# Patient Record
Sex: Male | Born: 1940 | Race: White | Hispanic: No | Marital: Married | Smoking: Former smoker
Health system: Southern US, Community
[De-identification: ages and names within clinical notes are randomized; demographics above are authoritative.]

## PROBLEM LIST (undated history)

## (undated) DIAGNOSIS — G4730 Sleep apnea, unspecified: Secondary | ICD-10-CM

## (undated) DIAGNOSIS — E871 Hypo-osmolality and hyponatremia: Secondary | ICD-10-CM

## (undated) DIAGNOSIS — E119 Type 2 diabetes mellitus without complications: Secondary | ICD-10-CM

## (undated) DIAGNOSIS — F319 Bipolar disorder, unspecified: Secondary | ICD-10-CM

## (undated) DIAGNOSIS — N401 Enlarged prostate with lower urinary tract symptoms: Secondary | ICD-10-CM

## (undated) DIAGNOSIS — E785 Hyperlipidemia, unspecified: Secondary | ICD-10-CM

## (undated) DIAGNOSIS — F32A Depression, unspecified: Secondary | ICD-10-CM

## (undated) DIAGNOSIS — F329 Major depressive disorder, single episode, unspecified: Secondary | ICD-10-CM

## (undated) DIAGNOSIS — N138 Other obstructive and reflux uropathy: Secondary | ICD-10-CM

## (undated) DIAGNOSIS — K5792 Diverticulitis of intestine, part unspecified, without perforation or abscess without bleeding: Secondary | ICD-10-CM

## (undated) HISTORY — PX: BACK SURGERY: SHX140

## (undated) HISTORY — DX: Benign prostatic hyperplasia with lower urinary tract symptoms: N40.1

## (undated) HISTORY — DX: Other obstructive and reflux uropathy: N13.8

## (undated) HISTORY — DX: Depression, unspecified: F32.A

## (undated) HISTORY — DX: Hyperlipidemia, unspecified: E78.5

## (undated) HISTORY — DX: Major depressive disorder, single episode, unspecified: F32.9

## (undated) HISTORY — DX: Type 2 diabetes mellitus without complications: E11.9

## (undated) HISTORY — PX: TONSILLECTOMY: SUR1361

## (undated) HISTORY — DX: Hypo-osmolality and hyponatremia: E87.1

## (undated) HISTORY — DX: Bipolar disorder, unspecified: F31.9

## (undated) HISTORY — DX: Diverticulitis of intestine, part unspecified, without perforation or abscess without bleeding: K57.92

## (undated) HISTORY — DX: Sleep apnea, unspecified: G47.30

---

## 2000-10-10 HISTORY — PX: CIRCUMCISION: SUR203

## 2009-08-17 ENCOUNTER — Ambulatory Visit: Payer: Self-pay | Admitting: Gastroenterology

## 2009-08-17 LAB — HM COLONOSCOPY

## 2009-08-27 LAB — HM COLONOSCOPY

## 2012-09-13 ENCOUNTER — Ambulatory Visit: Payer: Self-pay

## 2012-10-24 ENCOUNTER — Emergency Department: Payer: Self-pay | Admitting: Emergency Medicine

## 2012-10-24 LAB — COMPREHENSIVE METABOLIC PANEL
Alkaline Phosphatase: 91 U/L (ref 50–136)
Co2: 25 mmol/L (ref 21–32)
Creatinine: 0.91 mg/dL (ref 0.60–1.30)
EGFR (African American): >60
EGFR (Non-African Amer.): >60
Glucose: 215 mg/dL — ABNORMAL HIGH (ref 65–99)
Osmolality: 278 (ref 275–301)
SGOT(AST): 19 U/L (ref 15–37)
SGPT (ALT): 27 U/L (ref 12–78)
Sodium: 133 mmol/L — ABNORMAL LOW (ref 136–145)
Total Protein: 7.7 g/dL (ref 6.4–8.2)

## 2012-10-24 LAB — URINALYSIS, COMPLETE
Bacteria: NONE SEEN
Bilirubin,UR: NEGATIVE
Blood: NEGATIVE
Ketone: NEGATIVE
Leukocyte Esterase: NEGATIVE
Ph: 5 (ref 4.5–8.0)
RBC,UR: <1 /HPF (ref 0–5)
Specific Gravity: 1.026 (ref 1.003–1.030)
Squamous Epithelial: NONE SEEN
WBC UR: 1 /HPF (ref 0–5)

## 2012-10-24 LAB — CBC
HCT: 42.6 % (ref 40.0–52.0)
HGB: 14.6 g/dL (ref 13.0–18.0)
MCH: 28.5 pg (ref 26.0–34.0)
MCHC: 34.3 g/dL (ref 32.0–36.0)
Platelet: 300 10*3/uL (ref 150–440)

## 2012-10-24 LAB — CK TOTAL AND CKMB (NOT AT ARMC): CK, Total: 88 U/L (ref 35–232)

## 2012-10-24 LAB — TROPONIN I: Troponin-I: <0.02 ng/mL

## 2013-12-02 ENCOUNTER — Ambulatory Visit: Payer: Self-pay | Admitting: Family Medicine

## 2013-12-08 ENCOUNTER — Ambulatory Visit: Payer: Self-pay | Admitting: Family Medicine

## 2014-01-08 ENCOUNTER — Ambulatory Visit: Payer: Self-pay | Admitting: Family Medicine

## 2014-03-23 ENCOUNTER — Emergency Department: Payer: Self-pay | Admitting: Emergency Medicine

## 2014-03-23 LAB — URINALYSIS, COMPLETE
BILIRUBIN, UR: NEGATIVE
Bacteria: NONE SEEN
Blood: NEGATIVE
Glucose,UR: NEGATIVE mg/dL (ref 0–75)
Ketone: NEGATIVE
Leukocyte Esterase: NEGATIVE
Nitrite: NEGATIVE
PH: 6 (ref 4.5–8.0)
PROTEIN: NEGATIVE
RBC,UR: <1 /HPF (ref 0–5)
Specific Gravity: 1.012 (ref 1.003–1.030)
WBC UR: <1 /HPF (ref 0–5)

## 2014-03-23 LAB — BASIC METABOLIC PANEL
Anion Gap: 9 (ref 7–16)
BUN: 27 mg/dL — AB (ref 7–18)
CHLORIDE: 105 mmol/L (ref 98–107)
CO2: 21 mmol/L (ref 21–32)
CREATININE: 1.03 mg/dL (ref 0.60–1.30)
Calcium, Total: 8.7 mg/dL (ref 8.5–10.1)
EGFR (African American): >60
Glucose: 97 mg/dL (ref 65–99)
OSMOLALITY: 275 (ref 275–301)
Potassium: 3.4 mmol/L — ABNORMAL LOW (ref 3.5–5.1)
SODIUM: 135 mmol/L — AB (ref 136–145)

## 2014-03-23 LAB — CBC
HCT: 36.9 % — AB (ref 40.0–52.0)
HGB: 12.2 g/dL — ABNORMAL LOW (ref 13.0–18.0)
MCH: 25.7 pg — ABNORMAL LOW (ref 26.0–34.0)
MCHC: 33.1 g/dL (ref 32.0–36.0)
MCV: 78 fL — AB (ref 80–100)
Platelet: 295 10*3/uL (ref 150–440)
RBC: 4.75 10*6/uL (ref 4.40–5.90)
RDW: 16 % — ABNORMAL HIGH (ref 11.5–14.5)
WBC: 9.7 10*3/uL (ref 3.8–10.6)

## 2014-03-23 LAB — TROPONIN I: Troponin-I: <0.02 ng/mL

## 2014-03-23 LAB — ETHANOL: Ethanol: <3 mg/dL

## 2015-03-12 ENCOUNTER — Encounter: Payer: Self-pay | Admitting: Unknown Physician Specialty

## 2015-03-12 ENCOUNTER — Ambulatory Visit (INDEPENDENT_AMBULATORY_CARE_PROVIDER_SITE_OTHER): Payer: Medicare Other | Admitting: Unknown Physician Specialty

## 2015-03-12 VITALS — BP 151/93 | HR 81 | Temp 98.7°F | Wt 230.2 lb

## 2015-03-12 DIAGNOSIS — E119 Type 2 diabetes mellitus without complications: Secondary | ICD-10-CM | POA: Diagnosis not present

## 2015-03-12 DIAGNOSIS — I10 Essential (primary) hypertension: Secondary | ICD-10-CM | POA: Diagnosis not present

## 2015-03-12 DIAGNOSIS — Z794 Long term (current) use of insulin: Secondary | ICD-10-CM

## 2015-03-12 DIAGNOSIS — R1084 Generalized abdominal pain: Secondary | ICD-10-CM

## 2015-03-12 DIAGNOSIS — E7800 Pure hypercholesterolemia, unspecified: Secondary | ICD-10-CM

## 2015-03-12 DIAGNOSIS — E1149 Type 2 diabetes mellitus with other diabetic neurological complication: Secondary | ICD-10-CM | POA: Insufficient documentation

## 2015-03-12 NOTE — Assessment & Plan Note (Signed)
Pt with severe pain 3 days ago which seems to be getting better.  Discussed with patient that this may be a stomach virus or food contamination but would like to let it run it's course.  Check CBC to r/o diverticulitis.  He does see hematology to follow H/H.  He declines medication for nausea.

## 2015-03-12 NOTE — Progress Notes (Signed)
Date:  03/12/2015   Name:  Eugene Clements   DOB:  [birth year]   MRN:  [id number]  PCP:  Vonita MossMark Crissman, MD   This is a 74 y.o. male with PMH diabetes who is presenting  Chief Complaint: Nausea and Constipation   History of Present Illness: 4 days ago went to the beach for memorial weekend.  Woke up 3 days ago with terrible.  No diarrhea but having constipation.  Pain now is 2 on a 0-10 scale.  No diarrhea/vomiting.  Having nausea which has eased off as well. No change in urination and without dysuria.   No new change in medications. Last colonoscopy was 2010  Routing f/u:  Diabetes: Pt with stable DM taking 32 units of Levimir along with oral medications.  Due for Hmg A1C check.  No polyuria or polydipsia.   No chest pain, or SOB.  Check BS twice a day.  Numbers are typically 120-130 in the AM and varies in the evening.     Review of Systems:  Review of Systems  Constitutional: Negative for chills, diaphoresis, activity change, appetite change and fatigue.  Cardiovascular: Negative for chest pain.  Gastrointestinal: Negative for blood in stool and abdominal distention.     Patient Active Problem List   Diagnosis Date Noted  . Generalized abdominal pain 03/12/2015  . Type 2 diabetes mellitus without complication 03/12/2015    Prior to Admission medications   Medication Sig Start Date End Date Taking? Authorizing Provider  aspirin EC 81 MG tablet Take 81 mg by mouth daily.   Yes Historical Provider, MD  atorvastatin (LIPITOR) 20 MG tablet Take 20 mg by mouth daily.   Yes Historical Provider, MD  benazepril (LOTENSIN) 40 MG tablet Take 40 mg by mouth daily.   Yes Historical Provider, MD  busPIRone (BUSPAR) 15 MG tablet Take 30 mg by mouth 2 (two) times daily.   Yes Historical Provider, MD  insulin detemir (LEVEMIR) 100 UNIT/ML injection Inject into the skin at bedtime.   Yes Historical Provider, MD  meloxicam (MOBIC) 15 MG tablet Take 15 mg by mouth daily.   Yes Historical Provider, MD   omeprazole (PRILOSEC) 20 MG capsule Take 20 mg by mouth 2 (two) times daily before a meal.   Yes Historical Provider, MD  pramipexole (MIRAPEX) 0.125 MG tablet Take 0.125 mg by mouth 3 (three) times daily.   Yes Historical Provider, MD  venlafaxine XR (EFFEXOR-XR) 150 MG 24 hr capsule Take 150 mg by mouth daily with breakfast.   Yes Historical Provider, MD  venlafaxine XR (EFFEXOR-XR) 75 MG 24 hr capsule Take 75 mg by mouth daily with breakfast.   Yes Historical Provider, MD  metFORMIN (GLUMETZA) 500 MG (MOD) 24 hr tablet Take 1,000 mg by mouth daily with breakfast.    Historical Provider, MD    No Known Allergies  Past Surgical History  Procedure Laterality Date  . Tonsillectomy    . Circumcision  2002    History  Substance Use Topics  . Smoking status: Never Smoker   . Smokeless tobacco: Not on file  . Alcohol Use: 12.0 oz/week    10 Standard drinks or equivalent, 10 Cans of beer per week     Comment: Beer    Family History  Problem Relation Age of Onset  . Cancer Mother     breast  . Diabetes Mother   . Hypertension Mother   . Cancer Father     prostate    Medication list has been  reviewed and updated.  Physical Examination:    Physical Exam  Constitutional: He is oriented to person, place, and time. He appears well-developed and well-nourished. No distress.  HENT:  Head: Normocephalic and atraumatic.  Eyes: Conjunctivae and lids are normal. Right eye exhibits no discharge. Left eye exhibits no discharge. No scleral icterus.  Cardiovascular: Normal rate and regular rhythm.   Pulmonary/Chest: Effort normal. No respiratory distress.  Abdominal: Normal appearance and bowel sounds are normal. He exhibits no distension. There is no splenomegaly or hepatomegaly. There is no tenderness.  Musculoskeletal: Normal range of motion.  Neurological: He is alert and oriented to person, place, and time.  Skin: Skin is intact. No rash noted. No pallor.  Psychiatric: He has a  normal mood and affect. His behavior is normal. Judgment and thought content normal.    BP 151/93 mmHg  Pulse 81  Temp(Src) 98.7 F (37.1 C) (Oral)  Wt 230 lb 3.2 oz (104.418 kg)  SpO2 97%  Assessment and Plan:  Problem List Items Addressed This Visit      Endocrine   Type 2 diabetes mellitus without complication    Last Hgb A1C was 6.0.  Home numbers stable.  Check Hgb A1C today.  Reviewed labs from last visit with patient and all questions answered.  Check CMP, Hgb A1C, Lipid panel, Microalbumin, and uric Acid next visit      Relevant Orders   Hemoglobin A1c     Other   Generalized abdominal pain - Primary    Pt with severe pain 3 days ago which seems to be getting better.  Discussed with patient that this may be a stomach virus or food contamination but would like to let it run it's course.  Check CBC to r/o diverticulitis.  He does see hematology to follow H/H.  He declines medication for nausea.        Relevant Orders   CBC

## 2015-03-12 NOTE — Assessment & Plan Note (Signed)
Last Hgb A1C was 6.0.  Home numbers stable.  Check Hgb A1C today.  Reviewed labs from last visit with patient and all questions answered.  Check CMP, Hgb A1C, Lipid panel, Microalbumin, and uric Acid next visit

## 2015-03-13 LAB — CBC
HEMATOCRIT: 38.1 % (ref 37.5–51.0)
Hemoglobin: 12.2 g/dL — ABNORMAL LOW (ref 12.6–17.7)
MCH: 25.4 pg — ABNORMAL LOW (ref 26.6–33.0)
MCHC: 32 g/dL (ref 31.5–35.7)
MCV: 79 fL (ref 79–97)
Platelets: 323 10*3/uL (ref 150–379)
RBC: 4.81 x10E6/uL (ref 4.14–5.80)
RDW: 14.8 % (ref 12.3–15.4)
WBC: 7.6 10*3/uL (ref 3.4–10.8)

## 2015-03-13 LAB — HEMOGLOBIN A1C
Est. average glucose Bld gHb Est-mCnc: 140 mg/dL
Hgb A1c MFr Bld: 6.5 % — ABNORMAL HIGH (ref 4.8–5.6)

## 2015-03-14 ENCOUNTER — Other Ambulatory Visit: Payer: Self-pay | Admitting: Family Medicine

## 2015-03-23 ENCOUNTER — Ambulatory Visit: Payer: Self-pay | Admitting: Family Medicine

## 2015-04-16 ENCOUNTER — Other Ambulatory Visit: Payer: Self-pay | Admitting: Family Medicine

## 2015-04-16 NOTE — Telephone Encounter (Signed)
Rx Refill Request Received from:[facility] Medication: Pramipexole Last Seen:03/12/15 Next Due: 3months Last Prescription:10/20/14 270 w/1refill Order Placed please review, sign and send

## 2015-05-04 ENCOUNTER — Other Ambulatory Visit: Payer: Self-pay | Admitting: Family Medicine

## 2015-05-25 ENCOUNTER — Other Ambulatory Visit: Payer: Self-pay | Admitting: Family Medicine

## 2015-06-29 ENCOUNTER — Encounter: Payer: Self-pay | Admitting: Family Medicine

## 2015-06-29 ENCOUNTER — Telehealth: Payer: Self-pay | Admitting: Family Medicine

## 2015-06-29 ENCOUNTER — Ambulatory Visit (INDEPENDENT_AMBULATORY_CARE_PROVIDER_SITE_OTHER): Payer: Medicare Other | Admitting: Family Medicine

## 2015-06-29 DIAGNOSIS — Z23 Encounter for immunization: Secondary | ICD-10-CM | POA: Diagnosis not present

## 2015-06-29 DIAGNOSIS — E7800 Pure hypercholesterolemia, unspecified: Secondary | ICD-10-CM

## 2015-06-29 DIAGNOSIS — I10 Essential (primary) hypertension: Secondary | ICD-10-CM | POA: Diagnosis not present

## 2015-06-29 DIAGNOSIS — E1142 Type 2 diabetes mellitus with diabetic polyneuropathy: Secondary | ICD-10-CM

## 2015-06-29 LAB — LIPID PANEL PICCOLO, WAIVED
CHOLESTEROL PICCOLO, WAIVED: 106 mg/dL (ref <200)
Chol/HDL Ratio Piccolo,Waive: 2.8 mg/dL
HDL CHOL PICCOLO, WAIVED: 38 mg/dL — AB (ref >59)
LDL Chol Calc Piccolo Waived: 51 mg/dL (ref <100)
TRIGLYCERIDES PICCOLO,WAIVED: 86 mg/dL (ref <150)
VLDL Chol Calc Piccolo,Waive: 17 mg/dL (ref <30)

## 2015-06-29 LAB — BAYER DCA HB A1C WAIVED: HB A1C (BAYER DCA - WAIVED): 6.7 % (ref <7.0)

## 2015-06-29 LAB — MICROALBUMIN, URINE WAIVED
Creatinine, Urine Waived: 100 mg/dL (ref 10–300)
Microalb, Ur Waived: 30 mg/L — ABNORMAL HIGH (ref 0–19)
Microalb/Creat Ratio: <30 mg/g (ref <30)

## 2015-06-29 MED ORDER — ATORVASTATIN CALCIUM 20 MG PO TABS: 20.0000 mg | ORAL_TABLET | Freq: Every day | ORAL | Status: DC

## 2015-06-29 MED ORDER — METFORMIN HCL ER (MOD) 500 MG PO TB24: 1000.0000 mg | ORAL_TABLET | Freq: Every day | ORAL | Status: DC

## 2015-06-29 MED ORDER — VENLAFAXINE HCL ER 75 MG PO CP24: 75.0000 mg | ORAL_CAPSULE | Freq: Every day | ORAL | Status: DC

## 2015-06-29 MED ORDER — BENAZEPRIL HCL 40 MG PO TABS: 40.0000 mg | ORAL_TABLET | Freq: Every day | ORAL | Status: DC

## 2015-06-29 MED ORDER — VENLAFAXINE HCL ER 150 MG PO CP24: 150.0000 mg | ORAL_CAPSULE | Freq: Every day | ORAL | Status: DC

## 2015-06-29 NOTE — Telephone Encounter (Signed)
glumetza was sent over to [facility]rug but they normally fill  glucophage xr which is cheaper and they were wondering which one to fill

## 2015-06-29 NOTE — Assessment & Plan Note (Signed)
The current medical regimen is effective;  continue present plan and medications.  

## 2015-06-29 NOTE — Progress Notes (Signed)
BP 133/77 mmHg  Pulse 82  Temp(Src) 97.6 F (36.4 C)  Ht  (1.778 m)  Wt 230 lb (104.327 kg)  BMI 33.00 kg/m2  SpO2 97%   Subjective:    Patient ID: Eugene Clements, male    DOB: [birth year], 74 y.o.   MRN: [id number]  HPI: Eugene JOUNG is a 74 y.o. male  Chief Complaint  Patient presents with  . Diabetes  . Hyperlipidemia  . Hypertension   patient doing well on medications with no complaints no side effects takes mostly every day sometimes forgets No low blood sugar spells  Relevant past medical, surgical, family and social history reviewed and updated as indicated. Interim medical history since our last visit reviewed. Allergies and medications reviewed and updated.  Review of Systems  Constitutional: Negative.   Respiratory: Negative.   Cardiovascular: Negative.     Per HPI unless specifically indicated above     Objective:    BP 133/77 mmHg  Pulse 82  Temp(Src) 97.6 F (36.4 C)  Ht  (1.778 m)  Wt 230 lb (104.327 kg)  BMI 33.00 kg/m2  SpO2 97%  Wt Readings from Last 3 Encounters:  06/29/15 230 lb (104.327 kg)  03/12/15 230 lb 3.2 oz (104.418 kg)  12/30/14 222 lb (100.699 kg)    Physical Exam  Constitutional: He is oriented to person, place, and time. He appears well-developed and well-nourished. No distress.  HENT:  Head: Normocephalic and atraumatic.  Right Ear: Hearing normal.  Left Ear: Hearing normal.  Nose: Nose normal.  Eyes: Conjunctivae and lids are normal. Right eye exhibits no discharge. Left eye exhibits no discharge. No scleral icterus.  Cardiovascular: Normal rate and normal heart sounds.   Pulmonary/Chest: Effort normal and breath sounds normal. No respiratory distress.  Musculoskeletal: Normal range of motion.  Neurological: He is alert and oriented to person, place, and time.  Skin: Skin is intact. No rash noted.  Psychiatric: He has a normal mood and affect. His speech is normal and behavior is normal. Judgment and  thought content normal. Cognition and memory are normal.    Results for orders placed or performed in visit on 03/12/15  CBC  Result Value Ref Range   WBC 7.6 3.4 - 10.8 x10E3/uL   RBC 4.81 4.14 - 5.80 x10E6/uL   Hemoglobin 12.2 (L) 12.6 - 17.7 g/dL   Hematocrit 40.9 81.1 - 51.0 %   MCV 79 79 - 97 fL   MCH 25.4 (L) 26.6 - 33.0 pg   MCHC 32.0 31.5 - 35.7 g/dL   RDW 91.4 78.2 - 95.6 %   Platelets 323 150 - 379 x10E3/uL  Hemoglobin A1c  Result Value Ref Range   Hgb A1c MFr Bld 6.5 (H) 4.8 - 5.6 %   Est. average glucose Bld gHb Est-mCnc 140 mg/dL      Assessment & Plan:   Problem List Items Addressed This Visit      Cardiovascular and Mediastinum   Essential hypertension, benign    The current medical regimen is effective;  continue present plan and medications.       Relevant Medications   atorvastatin (LIPITOR) 20 MG tablet   benazepril (LOTENSIN) 40 MG tablet   Other Relevant Orders   Bayer DCA Hb A1c Waived   Lipid Panel Piccolo, Waived   Microalbumin, Urine Waived   Comprehensive metabolic panel   Uric acid     Endocrine   Type 2 diabetes mellitus without complication   Relevant Medications  atorvastatin (LIPITOR) 20 MG tablet   benazepril (LOTENSIN) 40 MG tablet   metFORMIN (GLUMETZA) 500 MG (MOD) 24 hr tablet     Other   Hypercholesterolemia    ...Marland KitchenMarland KitchenThe current medical regimen is effective;  continue present plan and medications.       Relevant Medications   atorvastatin (LIPITOR) 20 MG tablet   benazepril (LOTENSIN) 40 MG tablet   Other Relevant Orders   Bayer DCA Hb A1c Waived   Lipid Panel Piccolo, Waived   Microalbumin, Urine Waived   Comprehensive metabolic panel   Uric acid    Other Visit Diagnoses    Immunization due        Relevant Orders    Flu vaccine greater than or equal to 3yo preservative free IM (Completed)        Follow up plan: Return in about 3 months (around 09/28/2015), or if symptoms worsen or fail to improve, for 3 mo  a1c.

## 2015-06-30 LAB — COMPREHENSIVE METABOLIC PANEL
ALK PHOS: 79 IU/L (ref 39–117)
ALT: 22 IU/L (ref 0–44)
AST: 17 IU/L (ref 0–40)
Albumin/Globulin Ratio: 1.9 (ref 1.1–2.5)
Albumin: 4.4 g/dL (ref 3.5–4.8)
BUN/Creatinine Ratio: 25 — ABNORMAL HIGH (ref 10–22)
BUN: 21 mg/dL (ref 8–27)
Bilirubin Total: 0.3 mg/dL (ref 0.0–1.2)
CO2: 21 mmol/L (ref 18–29)
CREATININE: 0.84 mg/dL (ref 0.76–1.27)
Calcium: 9.1 mg/dL (ref 8.6–10.2)
Chloride: 99 mmol/L (ref 97–108)
GFR calc Af Amer: 100 mL/min/{1.73_m2} (ref >59)
GFR calc non Af Amer: 87 mL/min/{1.73_m2} (ref >59)
GLOBULIN, TOTAL: 2.3 g/dL (ref 1.5–4.5)
Glucose: 136 mg/dL — ABNORMAL HIGH (ref 65–99)
POTASSIUM: 4.4 mmol/L (ref 3.5–5.2)
SODIUM: 137 mmol/L (ref 134–144)
Total Protein: 6.7 g/dL (ref 6.0–8.5)

## 2015-06-30 LAB — URIC ACID: URIC ACID: 4.8 mg/dL (ref 3.7–8.6)

## 2015-07-01 ENCOUNTER — Encounter: Payer: Self-pay | Admitting: Family Medicine

## 2015-07-10 ENCOUNTER — Other Ambulatory Visit: Payer: Self-pay | Admitting: Family Medicine

## 2015-07-10 NOTE — Telephone Encounter (Signed)
Your patient 

## 2015-08-04 ENCOUNTER — Telehealth: Payer: Self-pay

## 2015-08-04 NOTE — Telephone Encounter (Signed)
Called in regards to coloncancer screening and diabetic eye exam.  No answer no answering machine.  

## 2015-09-29 ENCOUNTER — Ambulatory Visit (INDEPENDENT_AMBULATORY_CARE_PROVIDER_SITE_OTHER): Payer: Medicare Other | Admitting: Family Medicine

## 2015-09-29 ENCOUNTER — Encounter: Payer: Self-pay | Admitting: Family Medicine

## 2015-09-29 VITALS — BP 167/81 | HR 83 | Temp 97.4°F | Ht 69.6 in | Wt 232.0 lb

## 2015-09-29 DIAGNOSIS — Z794 Long term (current) use of insulin: Secondary | ICD-10-CM | POA: Diagnosis not present

## 2015-09-29 DIAGNOSIS — F32A Depression, unspecified: Secondary | ICD-10-CM | POA: Insufficient documentation

## 2015-09-29 DIAGNOSIS — E119 Type 2 diabetes mellitus without complications: Secondary | ICD-10-CM | POA: Diagnosis not present

## 2015-09-29 DIAGNOSIS — I10 Essential (primary) hypertension: Secondary | ICD-10-CM | POA: Diagnosis not present

## 2015-09-29 DIAGNOSIS — F329 Major depressive disorder, single episode, unspecified: Secondary | ICD-10-CM | POA: Diagnosis not present

## 2015-09-29 LAB — BAYER DCA HB A1C WAIVED: HB A1C (BAYER DCA - WAIVED): 6.1 % (ref <7.0)

## 2015-09-29 NOTE — Assessment & Plan Note (Signed)
Poor control with poor compliance of medication Discuss compliance issues Recheck at physical in March

## 2015-09-29 NOTE — Assessment & Plan Note (Signed)
The current medical regimen is effective;  continue present plan and medications.  

## 2015-09-29 NOTE — Progress Notes (Signed)
BP 167/81 mmHg  Pulse 83  Temp(Src) 97.4 F (36.3 C)  Ht 5' 9.6" (1.768 m)  Wt 232 lb (105.235 kg)  BMI 33.67 kg/m2  SpO2 97%   Subjective:    Patient ID: Eugene Clements, male    DOB: [birth year], 74 y.o.   MRN: [id number]  HPI: Eugene CaterRobert T Rathgeber is a 74 y.o. male  Chief Complaint  Patient presents with  . Diabetes   Patient surprised that diabetes is showing good control today of hemoglobin A1c 6.1 with especially the last 2 weeks of poor diet and eating at night. Patient also did not take blood pressure medicines today hasn't checked blood pressure at home to see if doing okay there Lipitor most days of no side effects Patient with a lot of stress his son is moved in with 2 grandchildren 5 and 8 Not taking Effexor every day Elijah Birkom is the primary babysitter or in the day for the children  Relevant past medical, surgical, family and social history reviewed and updated as indicated. Interim medical history since our last visit reviewed. Allergies and medications reviewed and updated.  Review of Systems  Constitutional: Negative.   Respiratory: Negative.   Cardiovascular: Negative.     Per HPI unless specifically indicated above     Objective:    BP 167/81 mmHg  Pulse 83  Temp(Src) 97.4 F (36.3 C)  Ht 5' 9.6" (1.768 m)  Wt 232 lb (105.235 kg)  BMI 33.67 kg/m2  SpO2 97%  Wt Readings from Last 3 Encounters:  09/29/15 232 lb (105.235 kg)  06/29/15 230 lb (104.327 kg)  03/12/15 230 lb 3.2 oz (104.418 kg)    Physical Exam  Constitutional: He is oriented to person, place, and time. He appears well-developed and well-nourished. No distress.  HENT:  Head: Normocephalic and atraumatic.  Right Ear: Hearing normal.  Left Ear: Hearing normal.  Nose: Nose normal.  Eyes: Conjunctivae and lids are normal. Right eye exhibits no discharge. Left eye exhibits no discharge. No scleral icterus.  Cardiovascular: Normal rate, regular rhythm and normal heart sounds.    Pulmonary/Chest: Effort normal and breath sounds normal. No respiratory distress.  Musculoskeletal: Normal range of motion.  Neurological: He is alert and oriented to person, place, and time.  Skin: Skin is intact. No rash noted.  Psychiatric: He has a normal mood and affect. His speech is normal and behavior is normal. Judgment and thought content normal. Cognition and memory are normal.    Results for orders placed or performed in visit on 06/29/15  Bayer DCA Hb A1c Waived  Result Value Ref Range   Bayer DCA Hb A1c Waived 6.7 <7.0 %  Lipid Panel Piccolo, Waived  Result Value Ref Range   Cholesterol Piccolo, Waived 106 <200 mg/dL   HDL Chol Piccolo, Waived 38 (L) >59 mg/dL   Triglycerides Piccolo,Waived 86 <150 mg/dL   Chol/HDL Ratio Piccolo,Waive 2.8 mg/dL   LDL Chol Calc Piccolo Waived 51 <100 mg/dL   VLDL Chol Calc Piccolo,Waive 17 <30 mg/dL  Microalbumin, Urine Waived  Result Value Ref Range   Microalb, Ur Waived 30 (H) 0 - 19 mg/L   Creatinine, Urine Waived 100 10 - 300 mg/dL   Microalb/Creat Ratio <30 <30 mg/g  Comprehensive metabolic panel  Result Value Ref Range   Glucose 136 (H) 65 - 99 mg/dL   BUN 21 8 - 27 mg/dL   Creatinine, Ser 3.080.84 0.76 - 1.27 mg/dL   GFR calc non Af Amer 87 >59 mL/min/1.73  GFR calc Af Amer 100 >59 mL/min/1.73   BUN/Creatinine Ratio 25 (H) 10 - 22   Sodium 137 134 - 144 mmol/L   Potassium 4.4 3.5 - 5.2 mmol/L   Chloride 99 97 - 108 mmol/L   CO2 21 18 - 29 mmol/L   Calcium 9.1 8.6 - 10.2 mg/dL   Total Protein 6.7 6.0 - 8.5 g/dL   Albumin 4.4 3.5 - 4.8 g/dL   Globulin, Total 2.3 1.5 - 4.5 g/dL   Albumin/Globulin Ratio 1.9 1.1 - 2.5   Bilirubin Total 0.3 0.0 - 1.2 mg/dL   Alkaline Phosphatase 79 39 - 117 IU/L   AST 17 0 - 40 IU/L   ALT 22 0 - 44 IU/L  Uric acid  Result Value Ref Range   Uric Acid 4.8 3.7 - 8.6 mg/dL      Assessment & Plan:   Problem List Items Addressed This Visit      Cardiovascular and Mediastinum   Essential  hypertension, benign    Poor control with poor compliance of medication Discuss compliance issues Recheck at physical in March        Endocrine   Type 2 diabetes mellitus without complication (HCC)    The current medical regimen is effective;  continue present plan and medications.         Other   Depression    Discuss stress anxiety exercise self-management techniques and taking medication every day. Full dose.       Other Visit Diagnoses    Diabetes mellitus without complication (HCC)    -  Primary    Relevant Orders    Bayer DCA Hb A1c Waived        Follow up plan: Return in about 3 months (around 12/28/2015) for Physical Exam A1C.

## 2015-09-29 NOTE — Assessment & Plan Note (Signed)
Discuss stress anxiety exercise self-management techniques and taking medication every day. Full dose.

## 2015-10-23 LAB — HM DIABETES EYE EXAM

## 2015-10-30 ENCOUNTER — Other Ambulatory Visit: Payer: Self-pay | Admitting: Family Medicine

## 2015-11-23 ENCOUNTER — Ambulatory Visit (INDEPENDENT_AMBULATORY_CARE_PROVIDER_SITE_OTHER): Payer: Medicare Other

## 2015-11-23 ENCOUNTER — Ambulatory Visit
Admission: EM | Admit: 2015-11-23 | Discharge: 2015-11-23 | Disposition: A | Discharge status: Home / Self Care | Payer: Medicare Other | Attending: Family Medicine | Admitting: Family Medicine

## 2015-11-23 DIAGNOSIS — L089 Local infection of the skin and subcutaneous tissue, unspecified: Secondary | ICD-10-CM

## 2015-11-23 DIAGNOSIS — M795 Residual foreign body in soft tissue: Secondary | ICD-10-CM | POA: Diagnosis not present

## 2015-11-23 DIAGNOSIS — S91332A Puncture wound without foreign body, left foot, initial encounter: Secondary | ICD-10-CM

## 2015-11-23 MED ORDER — TETANUS-DIPHTH-ACELL PERTUSSIS 5-2.5-18.5 LF-MCG/0.5 IM SUSP
0.5000 mL | Freq: Once | INTRAMUSCULAR | Status: AC
  Administered 2015-11-23: 0.5 mL via INTRAMUSCULAR

## 2015-11-23 MED ORDER — MUPIROCIN 2 % EX OINT: 1.0000 "application " | TOPICAL_OINTMENT | Freq: Three times a day (TID) | CUTANEOUS | Status: DC

## 2015-11-23 MED ORDER — CEFUROXIME AXETIL 500 MG PO TABS: 500.0000 mg | ORAL_TABLET | Freq: Two times a day (BID) | ORAL | Status: DC

## 2015-11-23 MED ORDER — CEFTRIAXONE SODIUM 1 G IJ SOLR
1.0000 g | Freq: Once | INTRAMUSCULAR | Status: AC
  Administered 2015-11-23: 1 g via INTRAMUSCULAR

## 2015-11-23 NOTE — ED Provider Notes (Signed)
CSN: [id number]     Arrival date & time 11/23/15  1810 History   First MD Initiated Contact with Patient 11/23/15 2035    Nurses notes were reviewed. Chief Complaint  Patient presents with  . Foreign Body    In left foot   According to the patient and his wife he stepped on a toothpick Thursday night.. He received a puncture wound to the bottom of his left foot they try to get the toothpick out was not sure if they got all other toothpick out. States that they put peroxide on it over the weekend but then today when he tried to walk on it was diagnosed pus coming from the foot. He is a diabetic. Is not sure when his last tetanus injection was. He denies any fever or pain in the bottom of his foot.  His mother had cancer and diabetes hypertension in his father had prostate cancer. He is a former smoker. Over 30 years.   (Consider location/radiation/quality/duration/timing/severity/associated sxs/prior Treatment) Patient is a 75 y.o. male presenting with foot injury. The history is provided by the patient and the spouse. No language interpreter was used.  Foot Injury Location:  Foot Foot location:  L foot Pain details:    Severity:  Severe   Onset quality:  Sudden   Timing:  Constant Chronicity:  New Foreign body present:  Unable to specify Tetanus status:  Out of date Prior injury to area:  No Relieved by:  Nothing Ineffective treatments: Peroxide. Associated symptoms: swelling   Risk factors: no concern for non-accidental trauma, no known bone disorder and no recent illness     Past Medical History  Diagnosis Date  . Organic bipolar disorder (HCC)   . Depression   . BPH (benign prostatic hypertrophy) with urinary obstruction   . Diverticulitis   . Hyperlipidemia   . Sleep apnea   . Hyponatremia   . Diabetes mellitus without complication [facility])    Past Surgical History  Procedure Laterality Date  . Tonsillectomy    . Circumcision  2002   Family History  Problem Relation  Age of Onset  . Cancer Mother     breast  . Diabetes Mother   . Hypertension Mother   . Cancer Father     prostate   Social History  Substance Use Topics  . Smoking status: Former Smoker    Quit date: 06/28/1969  . Smokeless tobacco: Never Used  . Alcohol Use: 12.0 oz/week    10 Cans of beer, 10 Standard drinks or equivalent per week     Comment: Beer    Review of Systems  Musculoskeletal: Positive for myalgias.  All other systems reviewed and are negative.   Allergies  Review of patient's allergies indicates no known allergies.  Home Medications   Prior to Admission medications   Medication Sig Start Date End Date Taking? Authorizing Provider  aspirin EC 81 MG tablet Take 81 mg by mouth daily.   Yes Historical Provider, MD  atorvastatin (LIPITOR) 20 MG tablet Take 1 tablet (20 mg total) by mouth daily. 06/29/15  Yes Steele Sizer, MD  benazepril (LOTENSIN) 40 MG tablet Take 1 tablet (40 mg total) by mouth daily. 06/29/15  Yes Steele Sizer, MD  busPIRone (BUSPAR) 15 MG tablet TAKE 2 TABLETS BY MOUTH TWICE DAILY. 07/13/15  Yes Steele Sizer, MD  LEVEMIR FLEXTOUCH 100 UNIT/ML Pen INJECT 20 TO 40 UNITS SUBCUTANEOUSLY AT BEDTIME. 05/04/15  Yes Steele Sizer, MD  meloxicam (MOBIC) 15  MG tablet TAKE 1 TABLET BY MOUTH ONCE DAILY. 05/25/15  Yes Steele Sizer, MD  metFORMIN (GLUMETZA) 500 MG (MOD) 24 hr tablet Take 2 tablets (1,000 mg total) by mouth daily with breakfast. 06/29/15  Yes Steele Sizer, MD  Multiple Vitamin (MULTIVITAMIN) tablet Take 1 tablet by mouth daily.   Yes Historical Provider, MD  omeprazole (PRILOSEC) 20 MG capsule Take 20 mg by mouth 2 (two) times daily before a meal.   Yes Historical Provider, MD  pramipexole (MIRAPEX) 0.125 MG tablet TAKE 3 TABLETS BY MOUTH AT BEDTIME. 10/30/15  Yes Megan P Johnson, DO  venlafaxine XR (EFFEXOR-XR) 150 MG 24 hr capsule Take 1 capsule (150 mg total) by mouth daily with breakfast. 06/29/15  Yes Steele Sizer, MD   venlafaxine XR (EFFEXOR-XR) 75 MG 24 hr capsule Take 1 capsule (75 mg total) by mouth daily with breakfast. 06/29/15  Yes Steele Sizer, MD  cefUROXime (CEFTIN) 500 MG tablet Take 1 tablet (500 mg total) by mouth 2 (two) times daily. 11/23/15   Hassan Rowan, MD  mupirocin ointment (BACTROBAN) 2 % Apply 1 application topically 3 (three) times daily. 11/23/15   Hassan Rowan, MD  NOVOFINE 32G X 6 MM MISC USE TO INJECT INSULIN AT BEDTIME 10/30/15   Megan Holly Bodily, DO   Meds Ordered and Administered this Visit   Medications  cefTRIAXone (ROCEPHIN) injection 1 g (1 g Intramuscular Given 11/23/15 2114)  Tdap (BOOSTRIX) injection 0.5 mL (0.5 mLs Intramuscular Given 11/23/15 2113)    BP 105/69 mmHg  Pulse 85  Temp(Src) 97.8 F (36.6 C) (Oral)  Resp 18  Ht 6' (1.829 m)  Wt 235 lb (106.595 kg)  BMI 31.86 kg/m2  SpO2 100% No data found.   Physical Exam  Constitutional: He is oriented to person, place, and time. He appears well-developed and well-nourished.  HENT:  Head: Normocephalic.  Eyes: Conjunctivae are normal. Pupils are equal, round, and reactive to light.  Neck: Neck supple.  Musculoskeletal: Normal range of motion. He exhibits tenderness.  Neurological: He is alert and oriented to person, place, and time.  Skin: Skin is warm and dry. There is erythema.  Psychiatric: He has a normal mood and affect.  Vitals reviewed.   ED Course  .Foreign Body Removal Date/Time: 11/23/2015 9:14 PM Performed by: Hassan Rowan Authorized by: Hassan Rowan Consent: Verbal consent obtained. Body area: skin General location: lower extremity Location details: left foot Local anesthetic: lidocaine 2% with epinephrine Patient sedated: no Patient restrained: no Patient cooperative: no Localization method: probed Removal mechanism: forceps Dressing: antibiotic ointment Tendon involvement: none Depth: subcutaneous Complexity: simple Post-procedure assessment: foreign body removed Patient  tolerance: Patient tolerated the procedure well with no immediate complications Comments: Left foot was explored. Explained to them that a negative x-ray does not necessarily rule out an FOB. Using forceps the top of the puncture wound was opened after was numb per patient request. A small little force substance almost look like a piece of lint/hairpiece was removed from the bottom of the foot very fine very small I'm not sure whether this is what he is felt in the foot. We'll can't go any further than the foot.   (including critical care time)  Labs Review Labs Reviewed - No data to display  Imaging Review Dg Foot Complete Left  11/23/2015  CLINICAL DATA:  Stepped on toothpick 5 days prior. Plantar wound under the fourth metatarsal head. EXAM: LEFT FOOT - COMPLETE 3+ VIEW COMPARISON:  None. FINDINGS: No fracture,  dislocation or suspicious focal osseous lesion. Lisfranc joint appears intact. There is soft tissue swelling in the plantar distal left foot. No radiopaque foreign body or other abnormal soft tissue density. No appreciable soft tissue gas. No appreciable degenerative or erosive arthropathy. No cortical erosions or periosteal reaction. Vascular calcifications. Small Achilles and plantar left calcaneal spurs. IMPRESSION: Soft tissue swelling in the plantar distal left foot. No radiographic evidence of osteomyelitis. No radiopaque foreign body. Electronically Signed   By: Delbert Phenix M.D.   On: 11/23/2015 20:01     Visual Acuity Review  Right Eye Distance:   Left Eye Distance:   Bilateral Distance:    Right Eye Near:   Left Eye Near:    Bilateral Near:         MDM   1. Puncture wound of foot excluding toes with infection, left, initial encounter   2. Foreign body (FB) in soft tissue    Explain that we'll can't go any further into the foot. This is superficial exploration we did get what appears be some debris out of the foot but not sure that this is everything. Shot of  Rocephin 1 g will be given tonight as well as tetanus update his tetanus immunization. We'll place on Ceftin 500 mg twice a day fact that ointment to the foot 2-3 times a day as well. Recommend soaking in Epsom salts. If he is not better by early Wednesday morning I recommend to his wife that she call the podiatrist at [facility] where she's gone before for him to be seen and evaluated on Wednesday. They understand that instructions and he appears to be and good spirits go home. I've also explained to them that by being a diabetic he is at risk of complications from foreign objects in his foot    Hassan Rowan, MD 11/23/15 2120

## 2015-11-23 NOTE — Discharge Instructions (Signed)
Puncture Wound A puncture wound is an injury that is caused by a sharp, thin object that goes through your skin, such as a nail. A puncture wound usually does not leave a large opening in your skin, so it may not bleed a lot. However, when you get a puncture wound, dirt or other materials (foreign bodies) can be forced into your wound and break off inside. This makes it more likely that an infection will happen, such as tetanus. HOME CARE Medicines  Take or apply over-the-counter and prescription medicines only as told by your doctor.  If you were prescribed an antibiotic medicine, take or apply it as told by your doctor. Do not stop using the antibiotic even if your condition starts to get better. Wound Care  There are many ways to close and cover a wound. For example, a wound can be covered with stitches (sutures), skin glue, or adhesive strips. Follow instructions from your doctor about:  How to take care of your wound.  When and how you should change your bandage (dressing).  When you should remove your bandage.  Removing whatever was used to close your wound.  Keep the bandage dry as told by your doctor. Do not take baths, swim, use a hot tub, or do anything that would put your wound underwater until your doctor says it is okay.  Clean the wound as told by your doctor.  Do not scratch or pick at the wound.  Check your wound every day for signs of infection. Watch for:  Redness, swelling, or pain.  Fluid, blood, or pus. General Instructions  Raise (elevate) the injured area above the level of your heart while you are sitting or lying down.  If your puncture wound is in your foot, ask your doctor if you need to avoid putting weight on your foot and for how long.  Keep all follow-up visits as told by your doctor. This is important. GET HELP IF:  You got a tetanus shot and you have any of these problems at the injection site:  Swelling.  Very bad  pain.  Redness.  Bleeding.  You have a fever.  Your stitches come out.  You notice a bad smell coming from your wound or your bandage.  You notice something coming out of the wound, such as wood or glass.  Medicine does not help your pain.  You have more redness, swelling, or pain at the site of your wound.  You have fluid, blood, or pus coming from your wound.  You notice a change in the color of your skin near your wound.  You need to change the bandage often because fluid, blood, or pus is coming from the wound.  You start to have a new rash.  You start to have numbness around the wound. GET HELP RIGHT AWAY IF:  You have very bad swelling around the wound.  Your pain suddenly gets worse and is very bad.  You start to get painful skin lumps.  You have a red streak going away from your wound.  The wound is on your hand or foot and you cannot move a finger or toe like you usually can.  The wound is on your hand or foot and you notice that your fingers or toes look pale or bluish.   This information is not intended to replace advice given to you by your health care provider. Make sure you discuss any questions you have with your health care provider.   Document  Released: 07/05/2008 Document Revised: 06/17/2015 Document Reviewed: 11/19/2014 Elsevier Interactive Patient Education 2016 Else[employer] Inc[employer]  Wound Care Taking care of your wound properly can help to prevent pain and infection. It can also help your wound to heal more quickly.  HOW TO CARE FOR YOUR WOUND  Take or apply over-the-counter and prescription medicines only as told by your health care provider.  If you were prescribed antibiotic medicine, take or apply it as told by your health care provider. Do not stop using the antibiotic even if your condition improves.  Clean the wound each day or as told by your health care provider.  Wash the wound with mild soap and water.  Rinse the wound with water to  remove all soap.  Pat the wound dry with a clean towel. Do not rub it.  There are many different ways to close and cover a wound. For example, a wound can be covered with stitches (sutures), skin glue, or adhesive strips. Follow instructions from your health care provider about:  How to take care of your wound.  When and how you should change your bandage (dressing).  When you should remove your dressing.  Removing whatever was used to close your wound.  Check your wound every day for signs of infection. Watch for:  Redness, swelling, or pain.  Fluid, blood, or pus.  Keep the dressing dry until your health care provider says it can be removed. Do not take baths, swim, use a hot tub, or do anything that would put your wound underwater until your health care provider approves.  Raise (elevate) the injured area above the level of your heart while you are sitting or lying down.  Do not scratch or pick at the wound.  Keep all follow-up visits as told by your health care provider. This is important. SEEK MEDICAL CARE IF:  You received a tetanus shot and you have swelling, severe pain, redness, or bleeding at the injection site.  You have a fever.  Your pain is not controlled with medicine.  You have increased redness, swelling, or pain at the site of your wound.  You have fluid, blood, or pus coming from your wound.  You notice a bad smell coming from your wound or your dressing. SEEK IMMEDIATE MEDICAL CARE IF:  You have a red streak going away from your wound.   This information is not intended to replace advice given to you by your health care provider. Make sure you discuss any questions you have with your health care provider.   Document Released: 07/05/2008 Document Revised: 02/10/2015 Document Reviewed: 09/22/2014 Elsevier Interactive Patient Education [employer]

## 2015-11-23 NOTE — ED Notes (Addendum)
Patient states that he stepped on a wooden toothpick this past Thursday with his left foot and now the skin has opened up on the bottom of foot and is draining blood.  Patient thinks that some of the toothpick is still inside his foot, but is concerned mostly with drainage and swelling.

## 2015-12-31 ENCOUNTER — Other Ambulatory Visit: Payer: Self-pay | Admitting: Family Medicine

## 2015-12-31 ENCOUNTER — Encounter: Payer: Medicare Other | Admitting: Family Medicine

## 2016-01-14 ENCOUNTER — Other Ambulatory Visit: Payer: Self-pay | Admitting: Family Medicine

## 2016-01-15 ENCOUNTER — Other Ambulatory Visit: Payer: Self-pay | Admitting: Family Medicine

## 2016-01-21 ENCOUNTER — Ambulatory Visit (INDEPENDENT_AMBULATORY_CARE_PROVIDER_SITE_OTHER): Payer: Medicare Other | Admitting: Family Medicine

## 2016-01-21 ENCOUNTER — Encounter: Payer: Self-pay | Admitting: Family Medicine

## 2016-01-21 VITALS — BP 129/72 | HR 87 | Temp 97.5°F | Ht 69.5 in | Wt 232.0 lb

## 2016-01-21 DIAGNOSIS — F329 Major depressive disorder, single episode, unspecified: Secondary | ICD-10-CM

## 2016-01-21 DIAGNOSIS — E7800 Pure hypercholesterolemia, unspecified: Secondary | ICD-10-CM

## 2016-01-21 DIAGNOSIS — N401 Enlarged prostate with lower urinary tract symptoms: Secondary | ICD-10-CM

## 2016-01-21 DIAGNOSIS — Z0000 Encounter for general adult medical examination without abnormal findings: Secondary | ICD-10-CM | POA: Diagnosis not present

## 2016-01-21 DIAGNOSIS — I10 Essential (primary) hypertension: Secondary | ICD-10-CM | POA: Diagnosis not present

## 2016-01-21 DIAGNOSIS — E1142 Type 2 diabetes mellitus with diabetic polyneuropathy: Secondary | ICD-10-CM | POA: Diagnosis not present

## 2016-01-21 DIAGNOSIS — F32A Depression, unspecified: Secondary | ICD-10-CM

## 2016-01-21 DIAGNOSIS — N138 Other obstructive and reflux uropathy: Secondary | ICD-10-CM

## 2016-01-21 LAB — URINALYSIS, ROUTINE W REFLEX MICROSCOPIC
BILIRUBIN UA: NEGATIVE
GLUCOSE, UA: NEGATIVE
Ketones, UA: NEGATIVE
LEUKOCYTES UA: NEGATIVE
Nitrite, UA: NEGATIVE
PH UA: 5.5 (ref 5.0–7.5)
PROTEIN UA: NEGATIVE
RBC, UA: NEGATIVE
Specific Gravity, UA: 1.02 (ref 1.005–1.030)
Urobilinogen, Ur: 0.2 mg/dL (ref 0.2–1.0)

## 2016-01-21 LAB — BAYER DCA HB A1C WAIVED: HB A1C (BAYER DCA - WAIVED): 7.1 % — ABNORMAL HIGH (ref <7.0)

## 2016-01-21 MED ORDER — BENAZEPRIL HCL 40 MG PO TABS: 40.0000 mg | ORAL_TABLET | Freq: Every day | ORAL | Status: DC

## 2016-01-21 MED ORDER — ATORVASTATIN CALCIUM 20 MG PO TABS: 20.0000 mg | ORAL_TABLET | Freq: Every day | ORAL | Status: DC

## 2016-01-21 MED ORDER — METFORMIN HCL ER (MOD) 500 MG PO TB24: 1000.0000 mg | ORAL_TABLET | Freq: Every day | ORAL | Status: AC

## 2016-01-21 MED ORDER — INSULIN DETEMIR 100 UNIT/ML FLEXPEN: PEN_INJECTOR | SUBCUTANEOUS | Status: DC

## 2016-01-21 MED ORDER — DULOXETINE HCL 60 MG PO CPEP: 60.0000 mg | ORAL_CAPSULE | Freq: Every day | ORAL | Status: DC

## 2016-01-21 MED ORDER — TAMSULOSIN HCL 0.4 MG PO CAPS: 0.4000 mg | ORAL_CAPSULE | Freq: Every day | ORAL | Status: DC

## 2016-01-21 MED ORDER — PRAMIPEXOLE DIHYDROCHLORIDE 0.125 MG PO TABS: ORAL_TABLET | ORAL | Status: DC

## 2016-01-21 NOTE — Assessment & Plan Note (Signed)
The current medical regimen is effective;  continue present plan and medications.  

## 2016-01-21 NOTE — Assessment & Plan Note (Signed)
Will do trial of tamsulosin.

## 2016-01-21 NOTE — Progress Notes (Signed)
BP 129/72 mmHg  Pulse 87  Temp(Src) 97.5 F (36.4 C)  Ht 5' 9.5" (1.765 m)  Wt 232 lb (105.235 kg)  BMI 33.78 kg/m2  SpO2 99%   Subjective:    Patient ID: Eugene Clements, male    DOB: Oct 08, 1941, 75 y.o.   MRN: [id number]  HPI: Eugene Clements is a 75 y.o. male  Chief Complaint  Patient presents with  . Annual Exam  Patient primary concern is diabetic peripheral neuropathy and restless legs Mirapex hasn't done that much Also great deal of stress wife is getting ready to start radiation for melanoma at [facility] next week will be daily for 4 weeks Diabetes doing well no issues with low blood sugar Blood pressure doing well no complaints from medications BPH sx  Relevant past medical, surgical, family and social history reviewed and updated as indicated. Interim medical history since our last visit reviewed. Allergies and medications reviewed and updated.  Review of Systems  Constitutional: Negative.   HENT: Negative.   Eyes: Negative.   Respiratory: Negative.   Cardiovascular: Negative.   Gastrointestinal: Negative.   Endocrine: Negative.   Genitourinary: Negative.   Musculoskeletal: Negative.   Skin: Negative.   Allergic/Immunologic: Negative.   Neurological: Negative.   Hematological: Negative.   Psychiatric/Behavioral: Negative.     Per HPI unless specifically indicated above     Objective:    BP 129/72 mmHg  Pulse 87  Temp(Src) 97.5 F (36.4 C)  Ht 5' 9.5" (1.765 m)  Wt 232 lb (105.235 kg)  BMI 33.78 kg/m2  SpO2 99%  Wt Readings from Last 3 Encounters:  01/21/16 232 lb (105.235 kg)  11/23/15 235 lb (106.595 kg)  09/29/15 232 lb (105.235 kg)    Physical Exam  Constitutional: He is oriented to person, place, and time. He appears well-developed and well-nourished.  HENT:  Head: Normocephalic and atraumatic.  Right Ear: External ear normal.  Left Ear: External ear normal.  Eyes: Conjunctivae and EOM are normal. Pupils are equal, round, and  reactive to light.  Neck: Normal range of motion. Neck supple.  Cardiovascular: Normal rate, regular rhythm, normal heart sounds and intact distal pulses.   Pulmonary/Chest: Effort normal and breath sounds normal.  Abdominal: Soft. Bowel sounds are normal. There is no splenomegaly or hepatomegaly.  Genitourinary: Rectum normal, prostate normal and penis normal.  Musculoskeletal: Normal range of motion.  Neurological: He is alert and oriented to person, place, and time. He has normal reflexes.  Skin: No rash noted. No erythema.  Psychiatric: He has a normal mood and affect. His behavior is normal. Judgment and thought content normal.    Results for orders placed or performed in visit on 10/23/15  HM DIABETES EYE EXAM  Result Value Ref Range   HM Diabetic Eye Exam No Retinopathy No Retinopathy      Assessment & Plan:   Problem List Items Addressed This Visit      Cardiovascular and Mediastinum   Essential hypertension, benign - Primary    The current medical regimen is effective;  continue present plan and medications.       Relevant Medications   atorvastatin (LIPITOR) 20 MG tablet   benazepril (LOTENSIN) 40 MG tablet   Other Relevant Orders   Comprehensive metabolic panel   CBC with Differential/Platelet   TSH   Urinalysis, Routine w reflex microscopic (not at [facility])     Endocrine   Type 2 diabetes mellitus with neurologic complication, without long-term current use of insulin (  HCC)    Will try switching from Effexor to Cymbalta see if doesn't help legs and will continue possible effect on depression. Continue current medications would like better control of diabetes but will not change medications      Relevant Medications   atorvastatin (LIPITOR) 20 MG tablet   benazepril (LOTENSIN) 40 MG tablet   Insulin Detemir (LEVEMIR FLEXTOUCH) 100 UNIT/ML Pen   metFORMIN (GLUMETZA) 500 MG (MOD) 24 hr tablet   Other Relevant Orders   Bayer DCA Hb A1c Waived   Comprehensive  metabolic panel     Genitourinary   BPH with obstruction/lower urinary tract symptoms    Will do trial of tamsulosin.      Relevant Medications   tamsulosin (FLOMAX) 0.4 MG CAPS capsule   Other Relevant Orders   PSA     Other   Hypercholesterolemia    The current medical regimen is effective;  continue present plan and medications.       Relevant Medications   atorvastatin (LIPITOR) 20 MG tablet   benazepril (LOTENSIN) 40 MG tablet   Other Relevant Orders   Comprehensive metabolic panel   Lipid panel   CBC with Differential/Platelet   TSH   Urinalysis, Routine w reflex microscopic (not at [facility])   Depression    The current medical regimen is effective;  continue present plan and medications.       Relevant Medications   DULoxetine (CYMBALTA) 60 MG capsule   Other Relevant Orders   Comprehensive metabolic panel   CBC with Differential/Platelet   TSH   Urinalysis, Routine w reflex microscopic (not at [facility])    Other Visit Diagnoses    PE (physical exam), annual            Follow up plan: Return in about 4 weeks (around 02/18/2016) for med check.

## 2016-01-21 NOTE — Assessment & Plan Note (Addendum)
Will try switching from Effexor to Cymbalta see if doesn't help legs and will continue possible effect on depression. Continue current medications would like better control of diabetes but will not change medications

## 2016-01-22 LAB — LIPID PANEL
CHOLESTEROL TOTAL: 95 mg/dL — AB (ref 100–199)
Chol/HDL Ratio: 2.6 ratio units (ref 0.0–5.0)
HDL: 37 mg/dL — AB (ref >39)
LDL Calculated: 30 mg/dL (ref 0–99)
TRIGLYCERIDES: 142 mg/dL (ref 0–149)
VLDL CHOLESTEROL CAL: 28 mg/dL (ref 5–40)

## 2016-01-22 LAB — COMPREHENSIVE METABOLIC PANEL
A/G RATIO: 1.7 (ref 1.2–2.2)
ALK PHOS: 81 IU/L (ref 39–117)
ALT: 18 IU/L (ref 0–44)
AST: 21 IU/L (ref 0–40)
Albumin: 4.3 g/dL (ref 3.5–4.8)
BILIRUBIN TOTAL: 0.2 mg/dL (ref 0.0–1.2)
BUN/Creatinine Ratio: 22 (ref 10–24)
BUN: 18 mg/dL (ref 8–27)
CHLORIDE: 98 mmol/L (ref 96–106)
CO2: 21 mmol/L (ref 18–29)
Calcium: 9.2 mg/dL (ref 8.6–10.2)
Creatinine, Ser: 0.81 mg/dL (ref 0.76–1.27)
GFR calc Af Amer: 101 mL/min/{1.73_m2} (ref >59)
GFR calc non Af Amer: 88 mL/min/{1.73_m2} (ref >59)
GLOBULIN, TOTAL: 2.6 g/dL (ref 1.5–4.5)
Glucose: 102 mg/dL — ABNORMAL HIGH (ref 65–99)
POTASSIUM: 4.1 mmol/L (ref 3.5–5.2)
SODIUM: 134 mmol/L (ref 134–144)
Total Protein: 6.9 g/dL (ref 6.0–8.5)

## 2016-01-22 LAB — CBC WITH DIFFERENTIAL/PLATELET
BASOS: 0 %
Basophils Absolute: 0 10*3/uL (ref 0.0–0.2)
EOS (ABSOLUTE): 0.4 10*3/uL (ref 0.0–0.4)
EOS: 5 %
HEMATOCRIT: 35.2 % — AB (ref 37.5–51.0)
HEMOGLOBIN: 11.1 g/dL — AB (ref 12.6–17.7)
Immature Grans (Abs): 0 10*3/uL (ref 0.0–0.1)
Immature Granulocytes: 1 %
LYMPHS ABS: 2.1 10*3/uL (ref 0.7–3.1)
Lymphs: 24 %
MCH: 22.5 pg — AB (ref 26.6–33.0)
MCHC: 31.5 g/dL (ref 31.5–35.7)
MCV: 71 fL — ABNORMAL LOW (ref 79–97)
MONOCYTES: 10 %
MONOS ABS: 0.9 10*3/uL (ref 0.1–0.9)
NEUTROS ABS: 5.2 10*3/uL (ref 1.4–7.0)
Neutrophils: 60 %
Platelets: 317 10*3/uL (ref 150–379)
RBC: 4.94 x10E6/uL (ref 4.14–5.80)
RDW: 17.5 % — ABNORMAL HIGH (ref 12.3–15.4)
WBC: 8.5 10*3/uL (ref 3.4–10.8)

## 2016-01-22 LAB — TSH: TSH: 0.623 u[IU]/mL (ref 0.450–4.500)

## 2016-01-22 LAB — PSA: Prostate Specific Ag, Serum: 2.4 ng/mL (ref 0.0–4.0)

## 2016-01-25 ENCOUNTER — Telehealth: Payer: Self-pay | Admitting: Family Medicine

## 2016-01-25 DIAGNOSIS — D649 Anemia, unspecified: Secondary | ICD-10-CM

## 2016-01-25 NOTE — Telephone Encounter (Signed)
Phone call Discussed with patient what looks to be iron deficiency anemia patient relates he has bleeding hemorrhoids from time to time. We will check repeat labs CBC and consider appropriate referrals patient will come in later this week.

## 2016-01-28 ENCOUNTER — Other Ambulatory Visit: Payer: Medicare Other

## 2016-01-28 DIAGNOSIS — D649 Anemia, unspecified: Secondary | ICD-10-CM

## 2016-01-29 LAB — CBC WITH DIFFERENTIAL/PLATELET
Basophils Absolute: 0 10*3/uL (ref 0.0–0.2)
Basos: 0 %
EOS (ABSOLUTE): 0.3 10*3/uL (ref 0.0–0.4)
EOS: 4 %
HEMATOCRIT: 34.7 % — AB (ref 37.5–51.0)
HEMOGLOBIN: 11 g/dL — AB (ref 12.6–17.7)
Immature Grans (Abs): 0 10*3/uL (ref 0.0–0.1)
Immature Granulocytes: 0 %
LYMPHS ABS: 1.5 10*3/uL (ref 0.7–3.1)
Lymphs: 21 %
MCH: 22.3 pg — AB (ref 26.6–33.0)
MCHC: 31.7 g/dL (ref 31.5–35.7)
MCV: 70 fL — AB (ref 79–97)
MONOCYTES: 9 %
MONOS ABS: 0.6 10*3/uL (ref 0.1–0.9)
NEUTROS ABS: 4.5 10*3/uL (ref 1.4–7.0)
Neutrophils: 66 %
Platelets: 342 10*3/uL (ref 150–379)
RBC: 4.94 x10E6/uL (ref 4.14–5.80)
RDW: 17.5 % — AB (ref 12.3–15.4)
WBC: 6.8 10*3/uL (ref 3.4–10.8)

## 2016-01-29 LAB — RETICULOCYTES: RETIC CT PCT: 1.1 % (ref 0.6–2.6)

## 2016-01-29 LAB — IRON AND TIBC
IRON SATURATION: 8 % — AB (ref 15–55)
IRON: 32 ug/dL — AB (ref 38–169)
Total Iron Binding Capacity: 414 ug/dL (ref 250–450)
UIBC: 382 ug/dL — ABNORMAL HIGH (ref 111–343)

## 2016-01-29 LAB — FERRITIN: Ferritin: 16 ng/mL — ABNORMAL LOW (ref 30–400)

## 2016-02-01 ENCOUNTER — Telehealth: Payer: Self-pay | Admitting: Family Medicine

## 2016-02-01 DIAGNOSIS — D509 Iron deficiency anemia, unspecified: Secondary | ICD-10-CM

## 2016-02-01 NOTE — Telephone Encounter (Signed)
Phone call Discussed with patient iron deficiency anemia agent turns out his regular donor at the [facility] has been told his hemoglobin was getting a little low Will refer to GI to further evaluate patient to start over-the-counter iron replacement

## 2016-02-11 ENCOUNTER — Other Ambulatory Visit: Payer: Self-pay | Admitting: Family Medicine

## 2016-02-15 ENCOUNTER — Telehealth: Payer: Self-pay | Admitting: Family Medicine

## 2016-02-15 NOTE — Telephone Encounter (Signed)
Patient called and stated that [facility] [place] send to them. He has an appt today at 10:30.

## 2016-02-16 ENCOUNTER — Encounter: Payer: Medicare Other | Admitting: Family Medicine

## 2016-02-18 ENCOUNTER — Ambulatory Visit: Payer: Medicare Other | Admitting: Family Medicine

## 2016-02-25 ENCOUNTER — Encounter: Payer: Self-pay | Admitting: Family Medicine

## 2016-02-25 ENCOUNTER — Ambulatory Visit (INDEPENDENT_AMBULATORY_CARE_PROVIDER_SITE_OTHER): Payer: Medicare Other | Admitting: Family Medicine

## 2016-02-25 VITALS — BP 162/82 | HR 91 | Temp 97.3°F | Ht 69.0 in | Wt 234.6 lb

## 2016-02-25 DIAGNOSIS — I10 Essential (primary) hypertension: Secondary | ICD-10-CM

## 2016-02-25 DIAGNOSIS — F32A Depression, unspecified: Secondary | ICD-10-CM

## 2016-02-25 DIAGNOSIS — F329 Major depressive disorder, single episode, unspecified: Secondary | ICD-10-CM | POA: Diagnosis not present

## 2016-02-25 DIAGNOSIS — D509 Iron deficiency anemia, unspecified: Secondary | ICD-10-CM | POA: Diagnosis not present

## 2016-02-25 LAB — CBC WITH DIFFERENTIAL/PLATELET
Hematocrit: 35.8 % — ABNORMAL LOW (ref 37.5–51.0)
Hemoglobin: 11.8 g/dL — ABNORMAL LOW (ref 12.6–17.7)
LYMPHS ABS: 1.4 10*3/uL (ref 0.7–3.1)
Lymphs: 18 %
MCH: 24.6 pg — ABNORMAL LOW (ref 26.6–33.0)
MCHC: 33 g/dL (ref 31.5–35.7)
MCV: 75 fL — ABNORMAL LOW (ref 79–97)
MID (ABSOLUTE): 0.9 10*3/uL (ref 0.1–1.6)
MID: 12 %
NEUTROS ABS: 5.8 10*3/uL (ref 1.4–7.0)
Neutrophils: 71 %
PLATELETS: 310 10*3/uL (ref 150–379)
RBC: 4.8 x10E6/uL (ref 4.14–5.80)
RDW: 19.3 % — ABNORMAL HIGH (ref 12.3–15.4)
WBC: 8.1 10*3/uL (ref 3.4–10.8)

## 2016-02-25 NOTE — Progress Notes (Signed)
BP 162/82 mmHg  Pulse 91  Temp(Src) 97.3 F (36.3 C)  Ht 5\' 9"  (1.753 m)  Wt 234 lb 9.6 oz (106.414 kg)  BMI 34.63 kg/m2  SpO2 97%   Subjective:    Patient ID: Eugene Clements, male    DOB: Dec 17, 1940, 75 y.o.   MRN: [id number]  HPI: Eugene CaterRobert T Logie is a 75 y.o. male  Chief Complaint  Patient presents with  . Medication Follow Up  . Hypertension  . Diabetes   Patient recheck Cymbalta doing well with medications maybe a little help with his legs no follow-up and depression care and mood. Patient has been dreaming more on Cymbalta Patient's blood pressure doing well.  Relevant past medical, surgical, family and social history reviewed and updated as indicated. Interim medical history since our last visit reviewed. Allergies and medications reviewed and updated.   Review of Systems  Constitutional: Negative.   Respiratory: Negative.   Cardiovascular: Negative.     Per HPI unless specifically indicated above     Objective:    BP 162/82 mmHg  Pulse 91  Temp(Src) 97.3 F (36.3 C)  Ht 5\' 9"  (1.753 m)  Wt 234 lb 9.6 oz (106.414 kg)  BMI 34.63 kg/m2  SpO2 97%  Wt Readings from Last 3 Encounters:  02/25/16 234 lb 9.6 oz (106.414 kg)  01/21/16 232 lb (105.235 kg)  11/23/15 235 lb (106.595 kg)    Physical Exam  Constitutional: He is oriented to person, place, and time. He appears well-developed and well-nourished. No distress.  HENT:  Head: Normocephalic and atraumatic.  Right Ear: Hearing normal.  Left Ear: Hearing normal.  Nose: Nose normal.  Eyes: Conjunctivae and lids are normal. Right eye exhibits no discharge. Left eye exhibits no discharge. No scleral icterus.  Cardiovascular: Normal rate, regular rhythm and normal heart sounds.   Pulmonary/Chest: Effort normal and breath sounds normal. No respiratory distress.  Musculoskeletal: Normal range of motion.  Neurological: He is alert and oriented to person, place, and time.  Skin: Skin is intact. No rash  noted.  Psychiatric: He has a normal mood and affect. His speech is normal and behavior is normal. Judgment and thought content normal. Cognition and memory are normal.    Results for orders placed or performed in visit on 01/28/16  CBC with Differential/Platelet  Result Value Ref Range   WBC 6.8 3.4 - 10.8 x10E3/uL   RBC 4.94 4.14 - 5.80 x10E6/uL   Hemoglobin 11.0 (L) 12.6 - 17.7 g/dL   Hematocrit 46.934.7 (L) 62.937.5 - 51.0 %   MCV 70 (L) 79 - 97 fL   MCH 22.3 (L) 26.6 - 33.0 pg   MCHC 31.7 31.5 - 35.7 g/dL   RDW 52.817.5 (H) 41.312.3 - 24.415.4 %   Platelets 342 150 - 379 x10E3/uL   Neutrophils 66 %   Lymphs 21 %   Monocytes 9 %   Eos 4 %   Basos 0 %   Neutrophils Absolute 4.5 1.4 - 7.0 x10E3/uL   Lymphocytes Absolute 1.5 0.7 - 3.1 x10E3/uL   Monocytes Absolute 0.6 0.1 - 0.9 x10E3/uL   EOS (ABSOLUTE) 0.3 0.0 - 0.4 x10E3/uL   Basophils Absolute 0.0 0.0 - 0.2 x10E3/uL   Immature Granulocytes 0 %   Immature Grans (Abs) 0.0 0.0 - 0.1 x10E3/uL  Ferritin  Result Value Ref Range   Ferritin 16 (L) 30 - 400 ng/mL  Reticulocytes  Result Value Ref Range   Retic Ct Pct 1.1 0.6 - 2.6 %  Iron and TIBC  Result Value Ref Range   Total Iron Binding Capacity 414 250 - 450 ug/dL   UIBC 161 (H) 096 - 045 ug/dL   Iron 32 (L) 38 - 409 ug/dL   Iron Saturation 8 (LL) 15 - 55 %      Assessment & Plan:   Problem List Items Addressed This Visit      Cardiovascular and Mediastinum   Essential hypertension, benign - Primary    .Marland KitchenThe current medical regimen is effective;  continue present plan and medications.         Other   Depression    Patient doing okay wants to take Cymbalta longer concerned about dreaming which affects should wane with ongoing use.      Relevant Medications   busPIRone (BUSPAR) 15 MG tablet   Anemia, iron deficiency    Patient's already followed up with GI has colonoscopy scheduled in July. Repeat CBC done today to establish stability showing improvement in hemoglobin. We'll  continue iron therapy also discussed hemorrhoid care.      Relevant Orders   CBC With Differential/Platelet       Follow up plan: Return in about 2 months (around 04/26/2016) for a1c, .

## 2016-02-25 NOTE — Assessment & Plan Note (Signed)
Patient's already followed up with GI has colonoscopy scheduled in July. Repeat CBC done today to establish stability showing improvement in hemoglobin. We'll continue iron therapy also discussed hemorrhoid care.

## 2016-02-25 NOTE — Assessment & Plan Note (Signed)
The current medical regimen is effective;  continue present plan and medications.  

## 2016-02-25 NOTE — Assessment & Plan Note (Signed)
Patient doing okay wants to take Cymbalta longer concerned about dreaming which affects should wane with ongoing use.

## 2016-03-19 ENCOUNTER — Other Ambulatory Visit: Payer: Self-pay | Admitting: Family Medicine

## 2016-04-14 ENCOUNTER — Encounter: Payer: Self-pay | Admitting: *Deleted

## 2016-04-14 ENCOUNTER — Ambulatory Visit
Admission: EM | Admit: 2016-04-14 | Discharge: 2016-04-14 | Disposition: A | Discharge status: Home / Self Care | Payer: Medicare Other

## 2016-04-14 ENCOUNTER — Other Ambulatory Visit: Payer: Self-pay

## 2016-04-14 DIAGNOSIS — S5011XA Contusion of right forearm, initial encounter: Secondary | ICD-10-CM

## 2016-04-14 DIAGNOSIS — W19XXXA Unspecified fall, initial encounter: Secondary | ICD-10-CM | POA: Diagnosis not present

## 2016-04-14 DIAGNOSIS — S0093XA Contusion of unspecified part of head, initial encounter: Secondary | ICD-10-CM | POA: Diagnosis not present

## 2016-04-14 DIAGNOSIS — S20211A Contusion of right front wall of thorax, initial encounter: Secondary | ICD-10-CM | POA: Diagnosis not present

## 2016-04-14 NOTE — ED Provider Notes (Signed)
CSN: [id number]     Arrival date & time 04/14/16  1646 History   None   Nurses notes were reviewed. Chief Complaint  Patient presents with  . Chest Pain  . Head Injury  . Arm Injury    Patient is a 75 year old white male who fell off an 8 foot roof a few hours ago. He states that he didn't have right she is on no heavy of been on a flat a foot medical. She denies any loss of consciousness but was on the ground. Which get back up. His grandson was ill at the time. He reports not having chest pain placed between a 6 and 8 right arm pain as well. He denies any head pain in the he does have a bruise on the right forehead as well. He denies  Any  major trauma in the past.  Patient has a history of organic bipolar disorder, depression BPH diverticulitis hyperlipidemia sleep apnea hyponatremia and diabetes. He's had tonsillectomy circumcision before. Mother had breast cancer diabetes and hypertension. Father had prostate cancer. He is a former smoker. He still drinks no known drug allergies.    (Consider location/radiation/quality/duration/timing/severity/associated sxs/prior Treatment) HPI  Past Medical History  Diagnosis Date  . Organic bipolar disorder (HCC)   . Depression   . BPH (benign prostatic hypertrophy) with urinary obstruction   . Diverticulitis   . Hyperlipidemia   . Sleep apnea   . Hyponatremia   . Diabetes mellitus without complication [facility](HCC)    Past Surgical History  Procedure Laterality Date  . Tonsillectomy    . Circumcision  2002   Family History  Problem Relation Age of Onset  . Cancer Mother     breast  . Diabetes Mother   . Hypertension Mother   . Cancer Father     prostate   Social History  Substance Use Topics  . Smoking status: Former Smoker    Quit date: 06/28/1969  . Smokeless tobacco: Never Used  . Alcohol Use: 12.0 oz/week    10 Cans of beer, 10 Standard drinks or equivalent per week     Comment: Beer    Review of Systems  All other systems  reviewed and are negative.   Allergies  Review of patient's allergies indicates no known allergies.  Home Medications   Prior to Admission medications   Medication Sig Start Date End Date Taking? Authorizing Provider  aspirin EC 81 MG tablet Take 81 mg by mouth daily.   Yes Historical Provider, MD  atorvastatin (LIPITOR) 20 MG tablet Take 1 tablet (20 mg total) by mouth daily. 01/21/16  Yes Steele SizerMark A Crissman, MD  benazepril (LOTENSIN) 40 MG tablet Take 1 tablet (40 mg total) by mouth daily. 01/21/16  Yes Steele SizerMark A Crissman, MD  busPIRone (BUSPAR) 15 MG tablet Take 15 mg by mouth. Two tablets twice a day 10/22/15  Yes Historical Provider, MD  DULoxetine (CYMBALTA) 60 MG capsule Take 1 capsule (60 mg total) by mouth daily. 01/21/16  Yes Steele SizerMark A Crissman, MD  meloxicam (MOBIC) 15 MG tablet TAKE 1 TABLET BY MOUTH ONCE DAILY. 02/11/16  Yes Steele SizerMark A Crissman, MD  metFORMIN (GLUMETZA) 500 MG (MOD) 24 hr tablet Take 2 tablets (1,000 mg total) by mouth daily with breakfast. 01/21/16  Yes Steele SizerMark A Crissman, MD  Multiple Vitamin (MULTIVITAMIN) tablet Take 1 tablet by mouth daily.   Yes Historical Provider, MD  NOVOFINE 32G X 6 MM MISC USE TO INJECT INSULIN AT BEDTIME 10/30/15  Yes Megan Holly BodilyP Johnson,  DO  omeprazole (PRILOSEC) 20 MG capsule TAKE 1 CAPSULE BY MOUTH TWICE DAILY. 03/21/16  Yes Megan P Johnson, DO  pramipexole (MIRAPEX) 0.125 MG tablet ,TAKE 1 TABLET IN THE MORNING AND TAKE 2 TABLETS BY MOUTH AT BEDTIME. 01/21/16  Yes Steele SizerMark A Crissman, MD  tamsulosin (FLOMAX) 0.4 MG CAPS capsule Take 1 capsule (0.4 mg total) by mouth daily. 01/21/16  Yes Steele SizerMark A Crissman, MD  Insulin Detemir (LEVEMIR FLEXTOUCH) 100 UNIT/ML Pen INJECT 20 TO 40 UNITS SUBCUTANEOUSLY AT BEDTIME. 01/21/16   Steele SizerMark A Crissman, MD   Meds Ordered and Administered this Visit  Medications - No data to display  BP 157/77 mmHg  Pulse 92  Temp(Src) 97.4 F (36.3 C) (Oral)  Resp 16  Ht 6' (1.829 m)  Wt 240 lb (108.863 kg)  BMI 32.54 kg/m2  SpO2 100% No  data found.   Physical Exam  Constitutional: He is oriented to person, place, and time. He appears well-developed and well-nourished.  HENT:  Head: Normocephalic. Head is with contusion.    Eyes: Conjunctivae are normal. Pupils are equal, round, and reactive to light.  Neck: Normal range of motion. Neck supple. No tracheal deviation present. No thyromegaly present.  Cardiovascular: Normal rate, regular rhythm and normal heart sounds.   Pulmonary/Chest: Effort normal and breath sounds normal. He exhibits tenderness.    Abdominal: Soft. Bowel sounds are normal. He exhibits no distension.  Musculoskeletal: Normal range of motion. He exhibits tenderness.       Right forearm: He exhibits tenderness, bony tenderness and swelling.       Arms: Lymphadenopathy:    He has no cervical adenopathy.  Neurological: He is alert and oriented to person, place, and time.  Skin: Skin is warm and dry. No rash noted.  Psychiatric: He has a normal mood and affect. His behavior is normal.  Vitals reviewed.   ED Course  Procedures (including critical care time)  Labs Review Labs Reviewed - No data to display  Imaging Review No results found.   Visual Acuity Review  Right Eye Distance:   Left Eye Distance:   Bilateral Distance:    Right Eye Near:   Left Eye Near:    Bilateral Near:         MDM   1. Chest wall contusion, right, initial encounter   2. Forearm contusion, right, initial encounter   3. Head contusion, initial encounter   4. Fall with significant injury, initial encounter       ED ECG REPORT I, Areon Cocuzza H, the attending physician, personally viewed and interpreted this ECG.   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 Patient will be absent lock started and EMS was called to transport.  [facility].  Hassan RowanEugene Sewell Pitner, MD 04/14/16 [phone]

## 2016-04-14 NOTE — ED Notes (Signed)
Pt fell from a roof, approx 8', landed on feet then right side. C/o chest pain that worsens with resp or movement, right forearm pain, and bruise to right frontal region.

## 2016-04-14 NOTE — Discharge Instructions (Signed)
Chest Contusion A contusion is a deep bruise. Bruises happen when an injury causes bleeding under the skin. Signs of bruising include pain, puffiness (swelling), and discolored skin. The bruise may turn blue, purple, or yellow.  HOME CARE  Put ice on the injured area.  Put ice in a plastic bag.  Place a towel between the skin and the bag.  Leave the ice on for 15-20 minutes at a time, 03-04 times a day for the first 48 hours.  Only take medicine as told by your doctor.  Rest.  Take deep breaths (deep-breathing exercises) as told by your doctor.  Stop smoking if you smoke.  Do not lift objects over 5 pounds (2.3 kilograms) for 3 days or longer if told by your doctor. GET HELP RIGHT AWAY IF:   You have more bruising or puffiness.  You have pain that gets worse.  You have trouble breathing.  You are dizzy, weak, or pass out (faint).  You have blood in your pee (urine) or poop (stool).  You cough up or throw up (vomit) blood.  Your puffiness or pain is not helped with medicines. MAKE SURE YOU:   Understand these instructions.  Will watch your condition.  Will get help right away if you are not doing well or get worse.   This information is not intended to replace advice given to you by your health care provider. Make sure you discuss any questions you have with your health care provider.   Document Released: 03/14/2008 Document Revised: 06/20/2012 Document Reviewed: 03/19/2012 Elsevier Interactive Patient Education 2016 Elsevier Inc.  Contusion A contusion is a deep bruise. Contusions happen when an injury causes bleeding under the skin. Symptoms of bruising include pain, swelling, and discolored skin. The skin may turn blue, purple, or yellow. HOME CARE   Rest the injured area.  If told, put ice on the injured area.  Put ice in a plastic bag.  Place a towel between your skin and the bag.  Leave the ice on for 20 minutes, 2-3 times per day.  If told, put  light pressure (compression) on the injured area using an elastic bandage. Make sure the bandage is not too tight. Remove it and put it back on as told by your doctor.  If possible, raise (elevate) the injured area above the level of your heart while you are sitting or lying down.  Take over-the-counter and prescription medicines only as told by your doctor. GET HELP IF:  Your symptoms do not get better after several days of treatment.  Your symptoms get worse.  You have trouble moving the injured area. GET HELP RIGHT AWAY IF:   You have very bad pain.  You have a loss of feeling (numbness) in a hand or foot.  Your hand or foot turns pale or cold.   This information is not intended to replace advice given to you by your health care provider. Make sure you discuss any questions you have with your health care provider.   Document Released: 03/14/2008 Document Revised: 06/17/2015 Document Reviewed: 02/11/2015 Elsevier Interactive Patient Education 2016 Elsevier Inc.  Facial or Scalp Contusion  A facial or scalp contusion is a deep bruise on the face or head. Contusions happen when an injury causes bleeding under the skin. Signs of bruising include pain, puffiness (swelling), and discolored skin. The contusion may turn blue, purple, or yellow. HOME CARE  Only take medicines as told by your doctor.  Put ice on the injured area.  Put ice in a plastic bag.  Place a towel between your skin and the bag.  Leave the ice on for 20 minutes, 2-3 times a day. GET HELP IF:  You have bite problems.  You have pain when chewing.  You are worried about your face not healing normally. GET HELP RIGHT AWAY IF:   You have severe pain or a headache and medicine does not help.  You are very tired or confused, or your personality changes.  You throw up (vomit).  You have a nosebleed that will not stop.  You see two of everything (double vision) or have blurry vision.  You have fluid  coming from your nose or ear.  You have problems walking or using your arms or legs. MAKE SURE YOU:   Understand these instructions.  Will watch your condition.  Will get help right away if you are not doing well or get worse.   This information is not intended to replace advice given to you by your health care provider. Make sure you discuss any questions you have with your health care provider.   Document Released: 09/15/2011 Document Revised: 10/17/2014 Document Reviewed: 05/09/2013 Elsevier Interactive Patient Education 2016 Elsevier Inc.  Hematoma A hematoma is a collection of blood. The collection of blood can turn into a hard, painful lump under the skin. Your skin may turn blue or yellow if the hematoma is close to the surface of the skin. Most hematomas get better in a few days to weeks. Some hematomas are serious and need medical care. Hematomas can be very small or very big. HOME CARE  Apply ice to the injured area:  Put ice in a plastic bag.  Place a towel between your skin and the bag.  Leave the ice on for 20 minutes, 2-3 times a day for the first 1 to 2 days.  After the first 2 days, switch to using warm packs on the injured area.  Raise (elevate) the injured area to lessen pain and puffiness (swelling). You may also wrap the area with an elastic bandage. Make sure the bandage is not wrapped too tight.  If you have a painful hematoma on your leg or foot, you may use crutches for a couple days.  Only take medicines as told by your doctor. GET HELP RIGHT AWAY IF:   Your pain gets worse.  Your pain is not controlled with medicine.  You have a fever.  Your puffiness gets worse.  Your skin turns more blue or yellow.  Your skin over the hematoma breaks or starts bleeding.  Your hematoma is in your chest or belly (abdomen) and you are short of breath, feel weak, or have a change in consciousness.  Your hematoma is on your scalp and you have a headache that  gets worse or a change in alertness or consciousness. MAKE SURE YOU:   Understand these instructions.  Will watch your condition.  Will get help right away if you are not doing well or get worse.   This information is not intended to replace advice given to you by your health care provider. Make sure you discuss any questions you have with your health care provider.   Document Released: 11/03/2004 Document Revised: 05/29/2013 Document Reviewed: 03/06/2013 Elsevier Interactive Patient Education [employer] Elsevier Inc.

## 2016-04-14 NOTE — ED Notes (Signed)
Pt [facility] via [facility].

## 2016-04-15 ENCOUNTER — Encounter: Admission: RE | Payer: Self-pay | Source: Ambulatory Visit

## 2016-04-15 ENCOUNTER — Ambulatory Visit: Admission: RE | Admit: 2016-04-15 | Payer: Medicare Other | Source: Ambulatory Visit | Admitting: Gastroenterology

## 2016-04-15 SURGERY — ESOPHAGOGASTRODUODENOSCOPY (EGD) WITH PROPOFOL
Anesthesia: General

## 2016-04-16 ENCOUNTER — Other Ambulatory Visit: Payer: Self-pay | Admitting: Family Medicine

## 2016-04-24 ENCOUNTER — Other Ambulatory Visit: Payer: Self-pay | Admitting: Family Medicine

## 2016-04-25 ENCOUNTER — Other Ambulatory Visit: Payer: Self-pay | Admitting: Family Medicine

## 2016-04-27 ENCOUNTER — Encounter: Payer: Self-pay | Admitting: Unknown Physician Specialty

## 2016-04-27 ENCOUNTER — Ambulatory Visit (INDEPENDENT_AMBULATORY_CARE_PROVIDER_SITE_OTHER): Payer: Medicare Other | Admitting: Unknown Physician Specialty

## 2016-04-27 VITALS — BP 122/80 | HR 97 | Temp 97.8°F | Ht 70.1 in | Wt 236.2 lb

## 2016-04-27 DIAGNOSIS — D509 Iron deficiency anemia, unspecified: Secondary | ICD-10-CM

## 2016-04-27 DIAGNOSIS — E1142 Type 2 diabetes mellitus with diabetic polyneuropathy: Secondary | ICD-10-CM

## 2016-04-27 LAB — CBC WITH DIFFERENTIAL/PLATELET
HEMATOCRIT: 37.4 % — AB (ref 37.5–51.0)
HEMOGLOBIN: 12.4 g/dL — AB (ref 12.6–17.7)
LYMPHS ABS: 1.4 10*3/uL (ref 0.7–3.1)
LYMPHS: 20 %
MCH: 25.3 pg — ABNORMAL LOW (ref 26.6–33.0)
MCHC: 33.2 g/dL (ref 31.5–35.7)
MCV: 76 fL — AB (ref 79–97)
MID (ABSOLUTE): 0.9 10*3/uL (ref 0.1–1.6)
MID: 13 %
Neutrophils Absolute: 4.8 10*3/uL (ref 1.4–7.0)
Neutrophils: 67 %
Platelets: 282 10*3/uL (ref 150–379)
RBC: 4.9 x10E6/uL (ref 4.14–5.80)
RDW: 18.5 % — ABNORMAL HIGH (ref 12.3–15.4)
WBC: 7.1 10*3/uL (ref 3.4–10.8)

## 2016-04-27 LAB — HEMOGLOBIN A1C
HEMOGLOBIN A1C: 6.2
HEMOGLOBIN A1C: 6.7

## 2016-04-27 LAB — BAYER DCA HB A1C WAIVED: HB A1C: 6.7 % (ref <7.0)

## 2016-04-27 NOTE — Assessment & Plan Note (Signed)
Improved with Hgb A1C of 6.7 today

## 2016-04-27 NOTE — Assessment & Plan Note (Signed)
Encouraged to follow through with GI.  H/H is improved.  Eat foods high in iron

## 2016-04-27 NOTE — Progress Notes (Signed)
BP 122/80 mmHg  Pulse 97  Temp(Src) 97.8 F (36.6 C)  Ht 5' 10.1" (1.781 m)  Wt 236 lb 3.7 oz (107.153 kg)  BMI 33.78 kg/m2  SpO2 97%   Subjective:    Patient ID: Eugene Clements, male    DOB: Sep 17, 1941, 75 y.o.   MRN: [id number]  HPI: Eugene CaterRobert T Domagalski is a 75 y.o. male  Chief Complaint  Patient presents with  . Diabetes   Reviewed Dr. Christell Faithrissman's notes.  Pt is here to check his Hgb A1C and needs his anemia checked.    Anemia Pt did not follow through on his GI consult as he doesn't feel like he needs it as the problem is due to chronic hemorrhoidal bleeding.  He is not able to tolerate his iron supplement and "doesn't believe" he is anemic and neither do his friends.    Diabetes: Using medications without difficulties No hypoglycemic episodes No hyperglycemic episodes Feet problems: none Blood Sugars averaging: eye exam within last year  Last Hgb A1C:7.1  Relevant past medical, surgical, family and social history reviewed and updated as indicated. Interim medical history since our last visit reviewed. Allergies and medications reviewed and updated.  Review of Systems  Per HPI unless specifically indicated above     Objective:    BP 122/80 mmHg  Pulse 97  Temp(Src) 97.8 F (36.6 C)  Ht 5' 10.1" (1.781 m)  Wt 236 lb 3.7 oz (107.153 kg)  BMI 33.78 kg/m2  SpO2 97%  Wt Readings from Last 3 Encounters:  04/27/16 236 lb 3.7 oz (107.153 kg)  04/14/16 240 lb (108.863 kg)  02/25/16 234 lb 9.6 oz (106.414 kg)    Physical Exam  Constitutional: He is oriented to person, place, and time. He appears well-developed and well-nourished. No distress.  HENT:  Head: Normocephalic and atraumatic.  Eyes: Conjunctivae and lids are normal. Right eye exhibits no discharge. Left eye exhibits no discharge. No scleral icterus.  Neck: Normal range of motion. Neck supple. No JVD present. Carotid bruit is not present.  Cardiovascular: Normal rate, regular rhythm and normal heart  sounds.   Pulmonary/Chest: Effort normal and breath sounds normal. No respiratory distress.  Abdominal: Normal appearance. There is no splenomegaly or hepatomegaly.  Musculoskeletal: Normal range of motion.  Neurological: He is alert and oriented to person, place, and time.  Skin: Skin is warm, dry and intact. No rash noted. No pallor.  Psychiatric: He has a normal mood and affect. His behavior is normal. Judgment and thought content normal.   H/H today is 12.4/37.4.  Still Microcytic  Results for orders placed or performed in visit on 02/25/16  CBC With Differential/Platelet  Result Value Ref Range   WBC 8.1 3.4 - 10.8 x10E3/uL   RBC 4.80 4.14 - 5.80 x10E6/uL   Hemoglobin 11.8 (L) 12.6 - 17.7 g/dL   Hematocrit 40.935.8 (L) 81.137.5 - 51.0 %   MCV 75 (L) 79 - 97 fL   MCH 24.6 (L) 26.6 - 33.0 pg   MCHC 33.0 31.5 - 35.7 g/dL   RDW 91.419.3 (H) 78.212.3 - 95.615.4 %   Platelets 310 150 - 379 x10E3/uL   Neutrophils 71 %   Lymphs 18 %   MID 12 %   Neutrophils Absolute 5.8 1.4 - 7.0 x10E3/uL   Lymphocytes Absolute 1.4 0.7 - 3.1 x10E3/uL   MID (Absolute) 0.9 0.1 - 1.6 X10E3/uL      Assessment & Plan:   Problem List Items Addressed This Visit  Unprioritized   Anemia, iron deficiency    Encouraged to follow through with GI.  H/H is improved.  Eat foods high in iron      Relevant Orders   CBC With Differential/Platelet   Type 2 diabetes mellitus with neurologic complication, without long-term current use of insulin (HCC) - Primary    Improved with Hgb A1C of 6.7 today      Relevant Orders   Bayer DCA Hb A1c Waived   Comprehensive metabolic panel       Follow up plan: Return in about 3 months (around 07/28/2016) for with Dr. Dossie Arbour.

## 2016-04-28 LAB — COMPREHENSIVE METABOLIC PANEL
ALBUMIN: 4.3 g/dL (ref 3.5–4.8)
ALT: 21 IU/L (ref 0–44)
AST: 23 IU/L (ref 0–40)
Albumin/Globulin Ratio: 1.8 (ref 1.2–2.2)
Alkaline Phosphatase: 95 IU/L (ref 39–117)
BUN/Creatinine Ratio: 27 — ABNORMAL HIGH (ref 10–24)
BUN: 22 mg/dL (ref 8–27)
Bilirubin Total: 0.4 mg/dL (ref 0.0–1.2)
CALCIUM: 9.5 mg/dL (ref 8.6–10.2)
CHLORIDE: 91 mmol/L — AB (ref 96–106)
CO2: 20 mmol/L (ref 18–29)
CREATININE: 0.81 mg/dL (ref 0.76–1.27)
GFR, EST AFRICAN AMERICAN: 101 mL/min/{1.73_m2} (ref >59)
GFR, EST NON AFRICAN AMERICAN: 88 mL/min/{1.73_m2} (ref >59)
GLUCOSE: 126 mg/dL — AB (ref 65–99)
Globulin, Total: 2.4 g/dL (ref 1.5–4.5)
Potassium: 4.7 mmol/L (ref 3.5–5.2)
Sodium: 128 mmol/L — ABNORMAL LOW (ref 134–144)
TOTAL PROTEIN: 6.7 g/dL (ref 6.0–8.5)

## 2016-04-29 ENCOUNTER — Encounter: Payer: Self-pay | Admitting: Unknown Physician Specialty

## 2016-05-06 ENCOUNTER — Other Ambulatory Visit: Payer: Self-pay | Admitting: Family Medicine

## 2016-05-06 DIAGNOSIS — N138 Other obstructive and reflux uropathy: Secondary | ICD-10-CM

## 2016-05-06 DIAGNOSIS — N401 Enlarged prostate with lower urinary tract symptoms: Principal | ICD-10-CM

## 2016-05-21 ENCOUNTER — Other Ambulatory Visit: Payer: Self-pay | Admitting: Family Medicine

## 2016-05-23 NOTE — Telephone Encounter (Signed)
Your patient 

## 2016-05-31 ENCOUNTER — Other Ambulatory Visit: Payer: Self-pay | Admitting: Family Medicine

## 2016-06-14 LAB — HM DIABETES EYE EXAM

## 2016-06-16 ENCOUNTER — Ambulatory Visit (INDEPENDENT_AMBULATORY_CARE_PROVIDER_SITE_OTHER): Payer: Medicare Other | Admitting: Family Medicine

## 2016-06-16 ENCOUNTER — Encounter: Payer: Self-pay | Admitting: Family Medicine

## 2016-06-16 VITALS — BP 137/72 | HR 89 | Wt 234.0 lb

## 2016-06-16 DIAGNOSIS — E1143 Type 2 diabetes mellitus with diabetic autonomic (poly)neuropathy: Secondary | ICD-10-CM | POA: Diagnosis not present

## 2016-06-16 DIAGNOSIS — Z23 Encounter for immunization: Secondary | ICD-10-CM | POA: Diagnosis not present

## 2016-06-16 DIAGNOSIS — M79604 Pain in right leg: Secondary | ICD-10-CM | POA: Diagnosis not present

## 2016-06-16 MED ORDER — PRAMIPEXOLE DIHYDROCHLORIDE 0.25 MG PO TABS: 0.2500 mg | ORAL_TABLET | Freq: Two times a day (BID) | ORAL | 2 refills | Status: DC

## 2016-06-16 NOTE — Progress Notes (Signed)
BP 137/72 (BP Location: Right Arm, Cuff Size: Normal)   Pulse 89   Wt 234 lb (106.1 kg) Comment: with shoes  SpO2 99%   BMI 33.48 kg/m    Subjective:    Patient ID: Eugene Clements, male    DOB: 1941-01-09, 75 y.o.   MRN: [id number]  HPI: Eugene Clements is a 75 y.o. male  Chief Complaint  Patient presents with  . Leg Pain    right, since Monday  Patient with right quadriceps pain spent the holiday weekend climbing up and down a chair painting his bathroom. Lead with his right leg climbing the chair No radicular pains no foot pains other issues  Patient concerned Cymbalta not doing enough of job taking Mirapex also for restless legs and still not having good control of restless legs.  Relevant past medical, surgical, family and social history reviewed and updated as indicated. Interim medical history since our last visit reviewed. Allergies and medications reviewed and updated.  Review of Systems  Constitutional: Negative.   Respiratory: Negative.   Cardiovascular: Negative.     Per HPI unless specifically indicated above     Objective:    BP 137/72 (BP Location: Right Arm, Cuff Size: Normal)   Pulse 89   Wt 234 lb (106.1 kg) Comment: with shoes  SpO2 99%   BMI 33.48 kg/m   Wt Readings from Last 3 Encounters:  06/16/16 234 lb (106.1 kg)  04/27/16 236 lb 3.7 oz (107.2 kg)  04/14/16 240 lb (108.9 kg)    Physical Exam  Constitutional: He is oriented to person, place, and time. He appears well-developed and well-nourished. No distress.  HENT:  Head: Normocephalic and atraumatic.  Right Ear: Hearing normal.  Left Ear: Hearing normal.  Nose: Nose normal.  Eyes: Conjunctivae and lids are normal. Right eye exhibits no discharge. Left eye exhibits no discharge. No scleral icterus.  Pulmonary/Chest: Effort normal. No respiratory distress.  Musculoskeletal: Normal range of motion.  Normal leg exam normal neurological exam of lower extremities good muscle tone and  balance  Neurological: He is alert and oriented to person, place, and time.  Skin: Skin is intact. No rash noted.  Psychiatric: He has a normal mood and affect. His speech is normal and behavior is normal. Judgment and thought content normal. Cognition and memory are normal.    Results for orders placed or performed in visit on 04/29/16  Hemoglobin A1c  Result Value Ref Range   Hemoglobin A1C 6.2   Hemoglobin A1c  Result Value Ref Range   Hemoglobin A1C 6.7   HM COLONOSCOPY  Result Value Ref Range   HM Colonoscopy See Report See Report, Patient Reported Normal  HM COLONOSCOPY  Result Value Ref Range   HM Colonoscopy See Report See Report, Patient Reported Normal      Assessment & Plan:   Problem List Items Addressed This Visit      Endocrine   Type 2 diabetes mellitus with neurologic complication, without long-term current use of insulin (HCC)    For neuropathy will increase Mirapex to 0.25 twice a day observe response       Other Visit Diagnoses    Immunization due    -  Primary   Relevant Orders   Flu vaccine HIGH DOSE PF (Fluzone High dose) (Completed)   Pain of right lower extremity       Discuss muscle soreness care and treatment leg strengthening exercises next week use of Tylenol and stretching exercises.  Follow up plan: Return for As scheduled, Hemoglobin A1c.

## 2016-06-16 NOTE — Assessment & Plan Note (Signed)
For neuropathy will increase Mirapex to 0.25 twice a day observe response

## 2016-06-20 ENCOUNTER — Emergency Department
Admission: EM | Admit: 2016-06-20 | Discharge: 2016-06-20 | Disposition: A | Discharge status: Home / Self Care | Payer: Medicare Other | Attending: Emergency Medicine | Admitting: Emergency Medicine

## 2016-06-20 ENCOUNTER — Emergency Department: Payer: Medicare Other

## 2016-06-20 DIAGNOSIS — Z7984 Long term (current) use of oral hypoglycemic drugs: Secondary | ICD-10-CM | POA: Diagnosis not present

## 2016-06-20 DIAGNOSIS — Z794 Long term (current) use of insulin: Secondary | ICD-10-CM | POA: Diagnosis not present

## 2016-06-20 DIAGNOSIS — S8011XA Contusion of right lower leg, initial encounter: Secondary | ICD-10-CM | POA: Insufficient documentation

## 2016-06-20 DIAGNOSIS — I10 Essential (primary) hypertension: Secondary | ICD-10-CM | POA: Insufficient documentation

## 2016-06-20 DIAGNOSIS — Z7982 Long term (current) use of aspirin: Secondary | ICD-10-CM | POA: Insufficient documentation

## 2016-06-20 DIAGNOSIS — Y929 Unspecified place or not applicable: Secondary | ICD-10-CM | POA: Insufficient documentation

## 2016-06-20 DIAGNOSIS — Y990 Civilian activity done for income or pay: Secondary | ICD-10-CM | POA: Insufficient documentation

## 2016-06-20 DIAGNOSIS — Z87891 Personal history of nicotine dependence: Secondary | ICD-10-CM | POA: Diagnosis not present

## 2016-06-20 DIAGNOSIS — E119 Type 2 diabetes mellitus without complications: Secondary | ICD-10-CM | POA: Insufficient documentation

## 2016-06-20 DIAGNOSIS — Z79899 Other long term (current) drug therapy: Secondary | ICD-10-CM | POA: Diagnosis not present

## 2016-06-20 DIAGNOSIS — Y9389 Activity, other specified: Secondary | ICD-10-CM | POA: Diagnosis not present

## 2016-06-20 DIAGNOSIS — X501XXA Overexertion from prolonged static or awkward postures, initial encounter: Secondary | ICD-10-CM | POA: Insufficient documentation

## 2016-06-20 DIAGNOSIS — M79604 Pain in right leg: Secondary | ICD-10-CM

## 2016-06-20 DIAGNOSIS — M79661 Pain in right lower leg: Secondary | ICD-10-CM

## 2016-06-20 DIAGNOSIS — S8991XA Unspecified injury of right lower leg, initial encounter: Secondary | ICD-10-CM | POA: Diagnosis present

## 2016-06-20 DIAGNOSIS — T148XXA Other injury of unspecified body region, initial encounter: Secondary | ICD-10-CM

## 2016-06-20 LAB — CK: Total CK: 147 U/L (ref 49–397)

## 2016-06-20 MED ORDER — MORPHINE SULFATE (PF) 4 MG/ML IV SOLN
4.0000 mg | Freq: Once | INTRAVENOUS | Status: DC
  Filled 2016-06-20: qty 1

## 2016-06-20 MED ORDER — HYDROCODONE-ACETAMINOPHEN 5-325 MG PO TABS: 1.0000 | ORAL_TABLET | ORAL | 0 refills | Status: DC | PRN

## 2016-06-20 MED ORDER — TRAMADOL HCL 50 MG PO TABS: 50.0000 mg | ORAL_TABLET | Freq: Four times a day (QID) | ORAL | 0 refills | Status: DC | PRN

## 2016-06-20 MED ORDER — OXYCODONE-ACETAMINOPHEN 5-325 MG PO TABS
1.0000 | ORAL_TABLET | Freq: Once | ORAL | Status: AC
  Administered 2016-06-20: 1 via ORAL
  Filled 2016-06-20: qty 1

## 2016-06-20 NOTE — ED Triage Notes (Signed)
Patient reports that he has had right leg pain since Monday week ago.  Reports that he was painting a room and was up and down on a chair until started to have pain in right thigh.  Patient reports that pain is now in right lower leg, but is a constant ache.

## 2016-06-20 NOTE — ED Notes (Signed)
Pt co pain in his right leg that started today in his thigh until he spent some time painting and using a small stepladder and now the pain has went down into his calf down into his ankle.  Pt reports no loss of sensation and has moderate posterior tibial pulses.  He says he has diabetes and that his pain quality is "aching" and "pins and needles" on that leg.

## 2016-06-20 NOTE — ED Provider Notes (Signed)
[facility] Emergency Department Provider Note   ____________________________________________   First MD Initiated Contact with Patient 06/20/16 [phone]     (approximate)  I have reviewed the triage vital signs and the nursing notes.   HISTORY  Chief Complaint Leg Pain   HPI Eugene Clements is a 75 y.o. male who comes into the hospital today with some right leg pain. The patient has had this pain for a week. He reports this started in his thigh and then moved down his leg. He reports the pain didn't room and climbing up on a chair. He initially saw his primary care physician earlier in the week and was told that he may have overworked his leg. He reports that now he is having pain in addition. He denies any trauma and denies any falls. He reports that it is very painful and he cannot walk.The patient has been taking ibuprofen and Aleve at home but it has not been helping. He has been resting and putting ice on it as well. He tried using a cane to walk which didn't help. The patient is unable to sleep because of so uncomfortable. He rates his pain a 10 out of 10 in intensity. He reports that the pain is currently better than it was when he came into the hospital. The patient reports that his doctors office they were concerned he could have a blood clot so he is concerned about that as well. He is here for evaluation   Past Medical History:  Diagnosis Date  . BPH (benign prostatic hypertrophy) with urinary obstruction   . Depression   . Diabetes mellitus without complication (HCC)   . Diverticulitis   . Hyperlipidemia   . Hyponatremia   . Organic bipolar disorder (HCC)   . Sleep apnea     Patient Active Problem List   Diagnosis Date Noted  . Anemia, iron deficiency 02/25/2016  . BPH with obstruction/lower urinary tract symptoms 01/21/2016  . Depression 09/29/2015  . Hypercholesterolemia 06/29/2015  . Essential hypertension, benign 06/29/2015  .  Generalized abdominal pain 03/12/2015  . Type 2 diabetes mellitus with neurologic complication, without long-term current use of insulin (HCC) 03/12/2015    Past Surgical History:  Procedure Laterality Date  . CIRCUMCISION  2002  . TONSILLECTOMY      Prior to Admission medications   Medication Sig Start Date End Date Taking? Authorizing Provider  aspirin EC 81 MG tablet Take 81 mg by mouth daily.    Historical Provider, MD  atorvastatin (LIPITOR) 20 MG tablet Take 1 tablet (20 mg total) by mouth daily. 01/21/16   Steele Sizer, MD  benazepril (LOTENSIN) 40 MG tablet Take 1 tablet (40 mg total) by mouth daily. 01/21/16   Steele Sizer, MD  busPIRone (BUSPAR) 15 MG tablet TAKE 2 TABLETS BY MOUTH TWICE DAILY. 04/25/16   Particia Nearing, PA-C  DULoxetine (CYMBALTA) 60 MG capsule TAKE 1 CAPSULE BY MOUTH ONCE DAILY 06/01/16   Steele Sizer, MD  HYDROcodone-acetaminophen (NORCO/VICODIN) 5-325 MG tablet Take 1 tablet by mouth every 4 (four) hours as needed for moderate pain. 06/20/16 06/20/17  Rebecka Apley, MD  Insulin Detemir (LEVEMIR FLEXTOUCH) 100 UNIT/ML Pen INJECT 20 TO 40 UNITS SUBCUTANEOUSLY AT BEDTIME. 01/21/16   Steele Sizer, MD  meloxicam (MOBIC) 15 MG tablet TAKE 1 TABLET BY MOUTH ONCE DAILY 05/23/16   Steele Sizer, MD  metFORMIN (GLUMETZA) 500 MG (MOD) 24 hr tablet Take 2 tablets (1,000 mg total)  by mouth daily with breakfast. 01/21/16   Steele Sizer, MD  Multiple Vitamin (MULTIVITAMIN) tablet Take 1 tablet by mouth daily.    Historical Provider, MD  NOVOFINE 32G X 6 MM MISC USE TO INJECT INSULIN AT BEDTIME 10/30/15   Megan P Johnson, DO  omeprazole (PRILOSEC) 20 MG capsule TAKE 1 CAPSULE BY MOUTH TWICE DAILY. 03/21/16   Megan P Johnson, DO  pramipexole (MIRAPEX) 0.25 MG tablet Take 1 tablet (0.25 mg total) by mouth 2 (two) times daily. 06/16/16   Steele Sizer, MD  tamsulosin (FLOMAX) 0.4 MG CAPS capsule TAKE 1 CAPSULE BY MOUTH ONCE DAILY 05/09/16   Steele Sizer, MD     Allergies Review of patient's allergies indicates no known allergies.  Family History  Problem Relation Age of Onset  . Cancer Mother     breast  . Diabetes Mother   . Hypertension Mother   . Cancer Father     prostate    Social History Social History  Substance Use Topics  . Smoking status: Former Smoker    Quit date: 06/28/1969  . Smokeless tobacco: Never Used  . Alcohol use 12.0 oz/week    10 Cans of beer, 10 Standard drinks or equivalent per week     Comment: Beer    Review of Systems Constitutional: No fever/chills Eyes: No visual changes. ENT: No sore throat. Cardiovascular: Denies chest pain. Respiratory: Denies shortness of breath. Gastrointestinal: No abdominal pain.  No nausea, no vomiting.  No diarrhea.  No constipation. Genitourinary: Negative for dysuria. Musculoskeletal: Right leg pain Skin: Negative for rash. Neurological: Negative for headaches, focal weakness or numbness.  10-point ROS otherwise negative.  ____________________________________________   PHYSICAL EXAM:  VITAL SIGNS: ED Triage Vitals  Enc Vitals Group     BP 06/20/16 0236 (!) 160/80     Pulse Rate 06/20/16 0235 81     Resp 06/20/16 0236 (!) 22     Temp 06/20/16 0236 97.9 F (36.6 C)     Temp Source 06/20/16 0236 Oral     SpO2 06/20/16 0235 98 %     Weight --      Height 06/20/16 0235 5\' 10"  (1.778 m)     Head Circumference --      Peak Flow --      Pain Score 06/20/16 0238 10     Pain Loc --      Pain Edu? --      Excl. in GC? --     Constitutional: Alert and oriented. Well appearing and in Moderate distress. Eyes: Conjunctivae are normal. PERRL. EOMI. Head: Atraumatic. Nose: No congestion/rhinnorhea. Mouth/Throat: Mucous membranes are moist.  Oropharynx non-erythematous. Cardiovascular: Normal rate, regular rhythm. Grossly normal heart sounds.  Good peripheral circulation. Respiratory: Normal respiratory effort.  No retractions. Lungs CTAB. Gastrointestinal:  Soft and nontender. No distention. Positive bowel sounds Musculoskeletal: Tenderness to palpation of anterior lower leg with some bruising Neurologic:  Normal speech and language.  Skin:  Skin is warm, dry and intact.Marland Kitchen Psychiatric: Mood and affect are normal.   ____________________________________________   LABS (all labs ordered are listed, but only abnormal results are displayed)  Labs Reviewed  CK   ____________________________________________  EKG  None ____________________________________________  RADIOLOGY  Right lower extremity ultrasound,  right lower extremity x-ray. ____________________________________________   PROCEDURES  Procedure(s) performed: None  Procedures  Critical Care performed: No  ____________________________________________   INITIAL IMPRESSION / ASSESSMENT AND PLAN / ED COURSE  Pertinent labs & imaging results that were  available during my care of the patient were reviewed by me and considered in my medical decision making (see chart for details).  The patient is a 75 year old male who comes into the hospital today with some right lower limb pain. The patient did receive a Percocet in triage and reports that the pain is improved. He reports though that he was still in some pain. The patient's ultrasound is unremarkable. I informed the patient that I would check a CK looking for possible rhabdomyolysis as well as a tib-fib x-ray looking for possible stress fracture. I will give the patient a dose of morphine and then reassess the patient.  Clinical Course  Value Comment By Time  US Venous Img Lower Unilateral Right No evidence of right lower extremity deep venous thrombosis. Rebecka ApleyAllison P Webster, MD 09/11 [phone]  DG Tibia/Fibula Right Negative. Rebecka ApleyAllison P Webster, MD 09/11 [phone]   The patient's ultrasound is unremarkable. He reports that he feels improved. I will have the patient follow-up with his primary care physician for further evaluation of  this leg pain. Otherwise patient has no further complaints or concerns. He will be discharged home.  ____________________________________________   FINAL CLINICAL IMPRESSION(S) / ED DIAGNOSES  Final diagnoses:  Right leg pain  Contusion  Pain in right shin      NEW MEDICATIONS STARTED DURING THIS VISIT:  Discharge Medication List as of 06/20/2016  7:13 AM    START taking these medications   Details  HYDROcodone-acetaminophen (NORCO/VICODIN) 5-325 MG tablet Take 1 tablet by mouth every 4 (four) hours as needed for moderate pain., Starting Mon 06/20/2016, Until Tue 06/20/2017, Print         Note:  This document was prepared using Dragon voice recognition software and may include unintentional dictation errors.    Rebecka ApleyAllison P Webster, MD 06/20/16 [phone]

## 2016-06-21 ENCOUNTER — Encounter: Payer: Self-pay | Admitting: Family Medicine

## 2016-06-21 ENCOUNTER — Ambulatory Visit (INDEPENDENT_AMBULATORY_CARE_PROVIDER_SITE_OTHER): Payer: Medicare Other | Admitting: Family Medicine

## 2016-06-21 VITALS — BP 159/90 | HR 75 | Temp 97.4°F | Ht 70.3 in | Wt 236.6 lb

## 2016-06-21 DIAGNOSIS — M79604 Pain in right leg: Secondary | ICD-10-CM | POA: Diagnosis not present

## 2016-06-21 MED ORDER — CYCLOBENZAPRINE HCL 10 MG PO TABS: 10.0000 mg | ORAL_TABLET | Freq: Three times a day (TID) | ORAL | 0 refills | Status: DC | PRN

## 2016-06-21 MED ORDER — HYDROCODONE-ACETAMINOPHEN 5-325 MG PO TABS
1.0000 | ORAL_TABLET | ORAL | 0 refills | Status: DC | PRN
Start: 2016-06-21 — End: 2016-07-12

## 2016-06-21 NOTE — Progress Notes (Signed)
BP (!) 159/90 (BP Location: Right Arm, Patient Position: Sitting, Cuff Size: Large)   Pulse 75   Temp 97.4 F (36.3 C)   Ht 5' 10.3" (1.786 m) Comment: pt had shoes on  Wt 236 lb 9.6 oz (107.3 kg) Comment: pt had shoes on  SpO2 98%   BMI 33.66 kg/m    Subjective:    Patient ID: Eugene Clements, male    DOB: 1941-06-20, 75 y.o.   MRN: [id number]  HPI: Eugene Clements is a 75 y.o. male  Chief Complaint  Patient presents with  . ER Follow Up    seen at ER for right leg pain, there note says to check a CK   Patient presents for ER follow up regarding right leg pain that started about 1 week ago. States no known injury, but painted a room a week ago, states it had a high ceiling and had to climb up and down ladder the entire time. Noticed the pain shortly after this. States it feels like shin splints felt when he's had them previously. DVT workup and x-ray right leg negative in ER, CK WNL. Given pain medicine which helps for a short time. Now also having achy reproducible pain in right low back muscles as well. Picked up some naproxen yesterday but hasn't taken any as he didn't know if he could mix it with the hydrocodone.   Relevant past medical, surgical, family and social history reviewed and updated as indicated. Interim medical history since our last visit reviewed. Allergies and medications reviewed and updated.  Review of Systems  Constitutional: Negative.   HENT: Negative.   Respiratory: Negative.   Cardiovascular: Negative.   Gastrointestinal: Negative.   Genitourinary: Negative.   Musculoskeletal: Positive for arthralgias, back pain and myalgias.  Skin: Negative.   Neurological: Negative.   Psychiatric/Behavioral: Negative.     Per HPI unless specifically indicated above     Objective:    BP (!) 159/90 (BP Location: Right Arm, Patient Position: Sitting, Cuff Size: Large)   Pulse 75   Temp 97.4 F (36.3 C)   Ht 5' 10.3" (1.786 m) Comment: pt had shoes on  Wt  236 lb 9.6 oz (107.3 kg) Comment: pt had shoes on  SpO2 98%   BMI 33.66 kg/m   Wt Readings from Last 3 Encounters:  06/21/16 236 lb 9.6 oz (107.3 kg)  06/16/16 234 lb (106.1 kg)  04/27/16 236 lb 3.7 oz (107.2 kg)    Physical Exam  Constitutional: He is oriented to person, place, and time. He appears well-developed and well-nourished.  HENT:  Head: Atraumatic.  Eyes: Conjunctivae are normal. No scleral icterus.  Neck: Neck supple.  Cardiovascular: Normal rate and normal heart sounds.   Pulmonary/Chest: Effort normal. No respiratory distress.  Abdominal: Soft. Bowel sounds are normal.  Musculoskeletal:  Antalgic gait Mild TTP over right shin area and right lumbar paraspinal muscles  Lymphadenopathy:    He has no cervical adenopathy.  Neurological: He is alert and oriented to person, place, and time.  Skin: Skin is warm and dry.  Psychiatric: He has a normal mood and affect. His behavior is normal.  Nursing note and vitals reviewed.   Results for orders placed or performed during the hospital encounter of 06/20/16  CK  Result Value Ref Range   Total CK 147 49 - 397 U/L      Assessment & Plan:   Problem List Items Addressed This Visit    None    Visit  Diagnoses    Pain of right lower extremity    -  Primary   Likely over-use, musculoskeletal in nature. Can stagger naproxen with hydrocodone as needed. Flexeril 2-3 times daily as tolerated.     Discussed sedation with flexeril and warnings about driving and such. Discussed only taking hydrocodone when absolutely needed, he is aware of addictive potential and will be cautious. Warm epsom salt baths, gentle stretching, massage, rest.    Follow up plan: Return in about 1 week (around 06/28/2016) for unless improved.

## 2016-06-21 NOTE — Patient Instructions (Signed)
Follow up as needed

## 2016-06-24 ENCOUNTER — Ambulatory Visit: Payer: Medicare Other | Admitting: Family Medicine

## 2016-06-27 ENCOUNTER — Other Ambulatory Visit: Payer: Self-pay | Admitting: Family Medicine

## 2016-06-27 NOTE — Telephone Encounter (Signed)
Your patient 

## 2016-06-28 ENCOUNTER — Ambulatory Visit
Admission: EM | Admit: 2016-06-28 | Discharge: 2016-06-28 | Disposition: A | Discharge status: Home / Self Care | Payer: Medicare Other | Attending: Family Medicine | Admitting: Family Medicine

## 2016-06-28 ENCOUNTER — Encounter: Payer: Self-pay | Admitting: *Deleted

## 2016-06-28 DIAGNOSIS — S86891A Other injury of other muscle(s) and tendon(s) at lower leg level, right leg, initial encounter: Secondary | ICD-10-CM

## 2016-06-28 MED ORDER — NAPROXEN 500 MG PO TABS
500.0000 mg | ORAL_TABLET | Freq: Two times a day (BID) | ORAL | 0 refills | Status: DC
Start: 2016-06-28 — End: 2016-07-25

## 2016-06-28 NOTE — ED Triage Notes (Signed)
Pt standing on a chair to paint on Labor day and right leg began to hurt. Pt saw his PCP for this and pain persists. Pain to lower right shin region. No deformity, edema.

## 2016-06-28 NOTE — ED Provider Notes (Signed)
CSN: [id number]     Arrival date & time 06/28/16  1224 History   First MD Initiated Contact with Patient 06/28/16 1349     Chief Complaint  Patient presents with  . Leg Pain   (Consider location/radiation/quality/duration/timing/severity/associated sxs/prior Treatment) HPI  This a 75 year old male presents with continued right leg pain that occurred around Labor Day. According to his wife he was painting a room having to stand up and down on a higher chair. Pain and symptoms began shortly afterwards. He states initially he seemed to be in his thigh and then later went to his knee and eventually down into his anterior shin. He saw his primary care physician first who told him that he "overworked" his leg and needed rest. Despite this he continued to have pain and then went to the emergency department at [facility]. There they performed a x-ray of the tibia-fibula which was normal and performed an ultrasound Doppler of his leg which showed no clots. On both occasions he was given oral pain medication and at the emergency room also provide him with some muscle relaxers. None of this has helped. He states today he was ascending a hill after he gone to the mailbox pain became so severe that they came here for treatment. He does state that on uneven ground seems to bother him the most. His pain is very sharply localized over the anterior shin more laterally. He has no calf pain. Pain is constant even at rest. Denies any numbness or tingling into his foot        Past Medical History:  Diagnosis Date  . BPH (benign prostatic hypertrophy) with urinary obstruction   . Depression   . Diabetes mellitus without complication (HCC)   . Diverticulitis   . Hyperlipidemia   . Hyponatremia   . Organic bipolar disorder (HCC)   . Sleep apnea    Past Surgical History:  Procedure Laterality Date  . CIRCUMCISION  2002  . TONSILLECTOMY     Family History  Problem Relation Age of Onset  . Cancer Mother     breast   . Diabetes Mother   . Hypertension Mother   . Cancer Father     prostate   Social History  Substance Use Topics  . Smoking status: Former Smoker    Quit date: 06/28/1969  . Smokeless tobacco: Never Used  . Alcohol use 12.0 oz/week    10 Cans of beer, 10 Standard drinks or equivalent per week     Comment: Beer    Review of Systems  Constitutional: Positive for activity change. Negative for chills, fatigue and fever.  Musculoskeletal: Positive for myalgias.  Neurological: Negative for numbness.  All other systems reviewed and are negative.   Allergies  Review of patient's allergies indicates no known allergies.  Home Medications   Prior to Admission medications   Medication Sig Start Date End Date Taking? Authorizing Provider  aspirin EC 81 MG tablet Take 81 mg by mouth daily.   Yes Historical Provider, MD  atorvastatin (LIPITOR) 20 MG tablet Take 1 tablet (20 mg total) by mouth daily. 01/21/16  Yes Steele Sizer, MD  benazepril (LOTENSIN) 40 MG tablet Take 1 tablet (40 mg total) by mouth daily. 01/21/16  Yes Steele Sizer, MD  busPIRone (BUSPAR) 15 MG tablet TAKE 2 TABLETS BY MOUTH TWICE DAILY. 04/25/16  Yes Particia Nearing, PA-C  cyclobenzaprine (FLEXERIL) 10 MG tablet Take 1 tablet (10 mg total) by mouth 3 (three) times daily as needed  for muscle spasms. 06/21/16  Yes Particia Nearingachel Elizabeth Lane, PA-C  DULoxetine (CYMBALTA) 60 MG capsule TAKE 1 CAPSULE BY MOUTH ONCE DAILY 06/01/16  Yes Steele SizerMark A Crissman, MD  HYDROcodone-acetaminophen (NORCO/VICODIN) 5-325 MG tablet Take 1 tablet by mouth every 4 (four) hours as needed for moderate pain. 06/21/16 06/21/17 Yes Particia Nearingachel Elizabeth Lane, PA-C  Insulin Detemir (LEVEMIR FLEXTOUCH) 100 UNIT/ML Pen INJECT 20 TO 40 UNITS SUBCUTANEOUSLY AT BEDTIME. 01/21/16  Yes Steele SizerMark A Crissman, MD  meloxicam (MOBIC) 15 MG tablet TAKE 1 TABLET BY MOUTH ONCE DAILY 05/23/16  Yes Steele SizerMark A Crissman, MD  metFORMIN (GLUMETZA) 500 MG (MOD) 24 hr tablet Take 2 tablets  (1,000 mg total) by mouth daily with breakfast. 01/21/16  Yes Steele SizerMark A Crissman, MD  Multiple Vitamin (MULTIVITAMIN) tablet Take 1 tablet by mouth daily.   Yes Historical Provider, MD  omeprazole (PRILOSEC) 20 MG capsule TAKE 1 CAPSULE BY MOUTH TWICE DAILY 06/27/16  Yes Steele SizerMark A Crissman, MD  pramipexole (MIRAPEX) 0.25 MG tablet Take 1 tablet (0.25 mg total) by mouth 2 (two) times daily. 06/16/16  Yes Steele SizerMark A Crissman, MD  tamsulosin (FLOMAX) 0.4 MG CAPS capsule TAKE 1 CAPSULE BY MOUTH ONCE DAILY 05/09/16  Yes Steele SizerMark A Crissman, MD  naproxen (NAPROSYN) 500 MG tablet Take 1 tablet (500 mg total) by mouth 2 (two) times daily with a meal. 06/28/16   Lutricia FeilWilliam P Joniel Graumann, PA-C  NOVOFINE 32G X 6 MM MISC USE TO INJECT INSULIN AT BEDTIME 10/30/15   Megan P Johnson, DO   Meds Ordered and Administered this Visit  Medications - No data to display  BP (!) 153/80 (BP Location: Left Arm)   Pulse 77   Temp (!) 96.8 F (36 C) (Tympanic)   Resp 20   Ht 5\' 10"  (1.778 m)   Wt 240 lb (108.9 kg)   SpO2 99%   BMI 34.44 kg/m  No data found.   Physical Exam  Constitutional: He is oriented to person, place, and time. He appears well-developed and well-nourished. No distress.  HENT:  Head: Normocephalic and atraumatic.  Eyes: EOM are normal. Pupils are equal, round, and reactive to light.  Neck: Normal range of motion. Neck supple.  Musculoskeletal: Normal range of motion.  Examination of the right lower extremity shows good hip range of motion is comfortable. The knee shows good range of motion to flexion extension. Patient has good quad control. There is no ligamentous laxity noted. Medication alone examination shows no significant swelling noticed. HEENT is most along the anterior shin in the lateral compartment. Is able to dorsiflex and plantarflex his foot fully. There is no hip esthesia to light touch of the foot likely the first and second webspaces. Pulses are equal and graded 2+ over 4 of the dorsalis pedis and  posterior tibialis arteries. Patient is able to ambulate the aid of a cane.  Neurological: He is alert and oriented to person, place, and time.  Skin: Skin is warm and dry. No rash noted. He is not diaphoretic. No erythema. No pallor.  Psychiatric: He has a normal mood and affect. His behavior is normal. Thought content normal.  Nursing note and vitals reviewed.   Urgent Care Course   Clinical Course    Procedures (including critical care time)  Labs Review Labs Reviewed - No data to display  Imaging Review No results found.   Visual Acuity Review  Right Eye Distance:   Left Eye Distance:   Bilateral Distance:    Right Eye Near:  Left Eye Near:    Bilateral Near:         MDM   1. Shin splints, right, initial encounter    Discharge Medication List as of 06/28/2016  2:18 PM    START taking these medications   Details  naproxen (NAPROSYN) 500 MG tablet Take 1 tablet (500 mg total) by mouth 2 (two) times daily with a meal., Starting Tue 06/28/2016, Normal      Plan: 1. Test/x-ray results and diagnosis reviewed with patient 2. rx as per orders; risks, benefits, potential side effects reviewed with patient 3. Recommend supportive treatment with Heat and ice for comfort. Patient must elevate his legs adequately help control pain. He must avoid symptoms . She was given written instructions and information regarding shin splints. He does not appear to have evidence of a compartment syndrome although this could develop and the patient and his wife were advised of this. His pain did eventually subside but if it does not or continues to increase he should go to the emergency department. 4. F/u prn if symptoms worsen or don't improve     Lutricia Feil, PA-C 06/28/16 1505

## 2016-07-12 ENCOUNTER — Other Ambulatory Visit: Payer: Self-pay | Admitting: Family Medicine

## 2016-07-12 MED ORDER — TRAZODONE HCL 50 MG PO TABS: 25.0000 mg | ORAL_TABLET | Freq: Every evening | ORAL | 3 refills | Status: AC | PRN

## 2016-07-12 NOTE — Progress Notes (Signed)
Phone call Discussed with patient still having marked pain from his shin splints will try trazodone. Also have patient stop Lipitor for now.

## 2016-07-22 ENCOUNTER — Emergency Department
Admission: EM | Admit: 2016-07-22 | Discharge: 2016-07-22 | Disposition: A | Discharge status: Home / Self Care | Payer: Medicare Other | Attending: Student | Admitting: Student

## 2016-07-22 DIAGNOSIS — Z79899 Other long term (current) drug therapy: Secondary | ICD-10-CM | POA: Diagnosis not present

## 2016-07-22 DIAGNOSIS — Z791 Long term (current) use of non-steroidal anti-inflammatories (NSAID): Secondary | ICD-10-CM | POA: Insufficient documentation

## 2016-07-22 DIAGNOSIS — Z794 Long term (current) use of insulin: Secondary | ICD-10-CM | POA: Insufficient documentation

## 2016-07-22 DIAGNOSIS — I10 Essential (primary) hypertension: Secondary | ICD-10-CM | POA: Insufficient documentation

## 2016-07-22 DIAGNOSIS — M79604 Pain in right leg: Secondary | ICD-10-CM | POA: Insufficient documentation

## 2016-07-22 DIAGNOSIS — Z87891 Personal history of nicotine dependence: Secondary | ICD-10-CM | POA: Insufficient documentation

## 2016-07-22 DIAGNOSIS — Z7982 Long term (current) use of aspirin: Secondary | ICD-10-CM | POA: Insufficient documentation

## 2016-07-22 DIAGNOSIS — M79661 Pain in right lower leg: Secondary | ICD-10-CM

## 2016-07-22 DIAGNOSIS — E119 Type 2 diabetes mellitus without complications: Secondary | ICD-10-CM | POA: Insufficient documentation

## 2016-07-22 MED ORDER — HYDROCODONE-ACETAMINOPHEN 5-325 MG PO TABS
2.0000 | ORAL_TABLET | Freq: Once | ORAL | Status: AC
  Administered 2016-07-22: 2 via ORAL
  Filled 2016-07-22: qty 2

## 2016-07-22 MED ORDER — HYDROCODONE-ACETAMINOPHEN 5-325 MG PO TABS: 1.0000 | ORAL_TABLET | Freq: Four times a day (QID) | ORAL | 0 refills | Status: DC | PRN

## 2016-07-22 NOTE — ED Provider Notes (Signed)
[facility] Emergency Department Provider Note   ____________________________________________   First MD Initiated Contact with Patient 07/22/16 [phone]     (approximate)  I have reviewed the triage vital signs and the nursing notes.   HISTORY  Chief Complaint Leg Pain    HPI Eugene Clements is a 75 y.o. male with history of depression, diabetes, hyperlipidemia, bipolar disorder, BPH who presents for evaluation of persistent right anterior leg/shin pain, ongoing for 6 weeks, atraumatic, constant, currently severe, improves with naproxen and Vicodin but does not go away. Patient was seen in this emergency department on 06/20/2016 at the onset of his symptoms. He had plain films of the tib-fib which were negative. Venous Doppler ultrasound was negative for DVT. He was discharged with Vicodin which improved his symptoms somewhat but his symptoms have persisted for several weeks. He was also seen at his primary care doctor's office and given trazodone for sleep, his symptoms are thought to be secondary to shin splints. Currently he is only taking naproxen for pain. He denies any new trauma. He denies any chest pain or difficulty breathing. No other symptoms include no vomiting, diarrhea, fevers or chills.   Past Medical History:  Diagnosis Date  . BPH (benign prostatic hypertrophy) with urinary obstruction   . Depression   . Diabetes mellitus without complication (HCC)   . Diverticulitis   . Hyperlipidemia   . Hyponatremia   . Organic bipolar disorder (HCC)   . Sleep apnea     Patient Active Problem List   Diagnosis Date Noted  . Anemia, iron deficiency 02/25/2016  . BPH with obstruction/lower urinary tract symptoms 01/21/2016  . Depression 09/29/2015  . Hypercholesterolemia 06/29/2015  . Essential hypertension, benign 06/29/2015  . Generalized abdominal pain 03/12/2015  . Type 2 diabetes mellitus with neurologic complication, without long-term current use  of insulin (HCC) 03/12/2015    Past Surgical History:  Procedure Laterality Date  . CIRCUMCISION  2002  . TONSILLECTOMY      Prior to Admission medications   Medication Sig Start Date End Date Taking? Authorizing Provider  aspirin EC 81 MG tablet Take 81 mg by mouth daily.    Historical Provider, MD  atorvastatin (LIPITOR) 20 MG tablet Take 1 tablet (20 mg total) by mouth daily. 01/21/16   Steele Sizer, MD  benazepril (LOTENSIN) 40 MG tablet Take 1 tablet (40 mg total) by mouth daily. 01/21/16   Steele Sizer, MD  busPIRone (BUSPAR) 15 MG tablet TAKE 2 TABLETS BY MOUTH TWICE DAILY. 04/25/16   Particia Nearing, PA-C  cyclobenzaprine (FLEXERIL) 10 MG tablet Take 1 tablet (10 mg total) by mouth 3 (three) times daily as needed for muscle spasms. 06/21/16   Particia Nearing, PA-C  DULoxetine (CYMBALTA) 60 MG capsule TAKE 1 CAPSULE BY MOUTH ONCE DAILY 06/01/16   Steele Sizer, MD  Insulin Detemir (LEVEMIR FLEXTOUCH) 100 UNIT/ML Pen INJECT 20 TO 40 UNITS SUBCUTANEOUSLY AT BEDTIME. 01/21/16   Steele Sizer, MD  meloxicam (MOBIC) 15 MG tablet TAKE 1 TABLET BY MOUTH ONCE DAILY 05/23/16   Steele Sizer, MD  metFORMIN (GLUMETZA) 500 MG (MOD) 24 hr tablet Take 2 tablets (1,000 mg total) by mouth daily with breakfast. 01/21/16   Steele Sizer, MD  Multiple Vitamin (MULTIVITAMIN) tablet Take 1 tablet by mouth daily.    Historical Provider, MD  naproxen (NAPROSYN) 500 MG tablet Take 1 tablet (500 mg total) by mouth 2 (two) times daily with a meal. 06/28/16  Lutricia FeilWilliam P Roemer, PA-C  NOVOFINE 32G X 6 MM MISC USE TO INJECT INSULIN AT BEDTIME 10/30/15   Megan P Johnson, DO  omeprazole (PRILOSEC) 20 MG capsule TAKE 1 CAPSULE BY MOUTH TWICE DAILY 06/27/16   Steele SizerMark A Crissman, MD  pramipexole (MIRAPEX) 0.25 MG tablet Take 1 tablet (0.25 mg total) by mouth 2 (two) times daily. 06/16/16   Steele SizerMark A Crissman, MD  tamsulosin (FLOMAX) 0.4 MG CAPS capsule TAKE 1 CAPSULE BY MOUTH ONCE DAILY 05/09/16   Steele SizerMark A  Crissman, MD  traZODone (DESYREL) 50 MG tablet Take 0.5-1 tablets (25-50 mg total) by mouth at bedtime as needed for sleep. 07/12/16   Steele SizerMark A Crissman, MD    Allergies Review of patient's allergies indicates no known allergies.  Family History  Problem Relation Age of Onset  . Cancer Mother     breast  . Diabetes Mother   . Hypertension Mother   . Cancer Father     prostate    Social History Social History  Substance Use Topics  . Smoking status: Former Smoker    Quit date: 06/28/1969  . Smokeless tobacco: Never Used  . Alcohol use 12.0 oz/week    10 Cans of beer, 10 Standard drinks or equivalent per week     Comment: Beer    Review of Systems Constitutional: No fever/chills Eyes: No visual changes. ENT: No sore throat. Cardiovascular: Denies chest pain. Respiratory: Denies shortness of breath. Gastrointestinal: No abdominal pain.  No nausea, no vomiting.  No diarrhea.  No constipation. Genitourinary: Negative for dysuria. Musculoskeletal: Negative for back pain. Skin: Negative for rash. Neurological: Negative for headaches, focal weakness or numbness.  10-point ROS otherwise negative.  ____________________________________________   PHYSICAL EXAM:  Vitals:   07/22/16 0129 07/22/16 0131 07/22/16 0228  BP: 129/83  (!) 146/81  Pulse: (!) 101  89  Resp: 20  18  Temp: 97.5 F (36.4 C)    TempSrc: Oral    SpO2: 97%  98%  Weight:  240 lb (108.9 kg)   Height:  5\' 10"  (1.778 m)     VITAL SIGNS: ED Triage Vitals  Enc Vitals Group     BP 07/22/16 0129 129/83     Pulse Rate 07/22/16 0129 (!) 101     Resp 07/22/16 0129 20     Temp 07/22/16 0129 97.5 F (36.4 C)     Temp Source 07/22/16 0129 Oral     SpO2 07/22/16 0129 97 %     Weight 07/22/16 0131 240 lb (108.9 kg)     Height 07/22/16 0131 5\' 10"  (1.778 m)     Head Circumference --      Peak Flow --      Pain Score 07/22/16 0131 10     Pain Loc --      Pain Edu? --      Excl. in GC? --      Constitutional: Alert and oriented. Well appearing and in no acute distress. Eyes: Conjunctivae are normal. PERRL. EOMI. Head: Atraumatic. Nose: No congestion/rhinnorhea. Mouth/Throat: Mucous membranes are moist.  Oropharynx non-erythematous. Neck: No stridor.  Supple without meningismus. Cardiovascular: Normal rate, regular rhythm. Grossly normal heart sounds.  Good peripheral circulation. Respiratory: Normal respiratory effort.  No retractions. Lungs CTAB. Gastrointestinal: Soft and nontender. No distention.  No CVA tenderness. Genitourinary: deferred Musculoskeletal: No lower extremity tenderness nor edema. No calf tenderness, soft compartments.  No joint effusions. Normal-appearing right lower extremity which is atraumatic, no swelling, no discoloration. Patient has mild  tenderness in the lower aspect of the shin however there is no bony step-off or deformity, full active painless range of motion at the right hip, knee, ankle. 2+ DP pulse in the right foot, wiggles the toes, intact sensation to light touch. Neurologic:  Normal speech and language. No gross focal neurologic deficits are appreciated. No gait instability. Skin:  Skin is warm, dry and intact. No rash noted. Psychiatric: Mood and affect are normal. Speech and behavior are normal.  ____________________________________________   LABS (all labs ordered are listed, but only abnormal results are displayed)  Labs Reviewed - No data to display ____________________________________________  EKG  none ____________________________________________  RADIOLOGY  none ____________________________________________   PROCEDURES  Procedure(s) performed: None  Procedures  Critical Care performed: No  ____________________________________________   INITIAL IMPRESSION / ASSESSMENT AND PLAN / ED COURSE  Pertinent labs & imaging results that were available during my care of the patient were reviewed by me and considered in  my medical decision making (see chart for details).  Eugene Clements is a 75 y.o. male with history of depression, diabetes, hyperlipidemia, bipolar disorder, BPH who presents for evaluation of persistent right anterior leg/shin pain, ongoing for 6 weeks, atraumatic, constant, currently severe, improves with naproxen and Vicodin. On exam, well appearing and in no acute distress. Mildly tachycardic on arrival however that has resolved at the time of my assessment and prior to any ER intervention. His leg exam is benign with the exception of mild tenderness in the right lower shin. He is neurovascularly intact, no concern for compartment syndrome, I do not think that this represents an acute DVT. No indication for additional imaging at this time. I discussed with him that I would provide a very short course of Vicodin for breakthrough pain, he would continue taking naproxen and I will provide phone number for orthopedic surgery so that he can be further evaluated for this. He will call to schedule appointment. We discussed meticulous return precautions and he is comfortable with the discharge plan. DC home.  Clinical Course     ____________________________________________   FINAL CLINICAL IMPRESSION(S) / ED DIAGNOSES  Final diagnoses:  Pain in shin, right      NEW MEDICATIONS STARTED DURING THIS VISIT:  New Prescriptions   No medications on file     Note:  This document was prepared using Dragon voice recognition software and may include unintentional dictation errors.    Gayla Doss, MD 07/22/16 [id number]

## 2016-07-22 NOTE — ED Notes (Signed)
Discharge instructions reviewed with patient. Questions fielded by this RN. Patient verbalizes understanding of instructions. Patient discharged home in stable condition per Gayle. MD . No acute distress noted at time of discharge.   

## 2016-07-22 NOTE — ED Triage Notes (Signed)
Pt in with co leg pain x 6 weeks and was dx with shin splints. Pt has pain to anterior right lower leg, was told to rest and ice it. Has been seen in several clinics for the same including the ED. Taking naproxen and vicodin states pain is persistent.

## 2016-07-25 ENCOUNTER — Ambulatory Visit (INDEPENDENT_AMBULATORY_CARE_PROVIDER_SITE_OTHER): Payer: Medicare Other | Admitting: Family Medicine

## 2016-07-25 ENCOUNTER — Encounter: Payer: Self-pay | Admitting: Family Medicine

## 2016-07-25 VITALS — BP 149/71 | HR 99 | Temp 97.5°F | Wt 230.0 lb

## 2016-07-25 DIAGNOSIS — M79604 Pain in right leg: Secondary | ICD-10-CM

## 2016-07-25 MED ORDER — NAPROXEN 500 MG PO TABS: 500.0000 mg | ORAL_TABLET | Freq: Two times a day (BID) | ORAL | 0 refills | Status: DC

## 2016-07-25 MED ORDER — HYDROCODONE-ACETAMINOPHEN 5-325 MG PO TABS: 1.0000 | ORAL_TABLET | Freq: Four times a day (QID) | ORAL | 0 refills | Status: AC | PRN

## 2016-07-25 NOTE — Patient Instructions (Signed)
Follow up as needed

## 2016-07-25 NOTE — Progress Notes (Signed)
   BP (!) 149/71   Pulse 99   Temp 97.5 F (36.4 C)   Wt 230 lb (104.3 kg)   SpO2 99%   BMI 33.00 kg/m    Subjective:    Patient ID: Eugene Clements, male    DOB: 1941-02-14, 75 y.o.   MRN: [id number]  HPI: Eugene Clements is a 75 y.o. male  Chief Complaint  Patient presents with  . Leg Pain    right lower leg since the beginning of Sept. Has been diagnosed as having shin splints.    Patient presents for follow up of right LE pain that has been ongoing for almost 2 months now. Has been to the ER and [facility]Cs multiple times as well for this complaint, full workup has been negative consistently. Dx remains shin splints. Has been controlling pain with naproxen and hydrocodone prn. Has orthopedic appt Friday and is out of pain medication, wanting some more to tide him over until he can see the specialist.   Relevant past medical, surgical, family and social history reviewed and updated as indicated. Interim medical history since our last visit reviewed. Allergies and medications reviewed and updated.  Review of Systems  Constitutional: Negative.   HENT: Negative.   Respiratory: Negative.   Cardiovascular: Negative.   Gastrointestinal: Negative.   Genitourinary: Negative.   Musculoskeletal: Positive for arthralgias.  Neurological: Negative.   Psychiatric/Behavioral: Negative.     Per HPI unless specifically indicated above     Objective:    BP (!) 149/71   Pulse 99   Temp 97.5 F (36.4 C)   Wt 230 lb (104.3 kg)   SpO2 99%   BMI 33.00 kg/m   Wt Readings from Last 3 Encounters:  07/25/16 230 lb (104.3 kg)  07/22/16 240 lb (108.9 kg)  06/28/16 240 lb (108.9 kg)    Physical Exam  Constitutional: He is oriented to person, place, and time. He appears well-developed and well-nourished. No distress.  HENT:  Head: Atraumatic.  Eyes: Conjunctivae are normal. No scleral icterus.  Neck: Normal range of motion. Neck supple.  Cardiovascular: Normal rate and normal heart sounds.     Pulmonary/Chest: Effort normal and breath sounds normal. No respiratory distress.  Musculoskeletal: Normal range of motion. He exhibits tenderness (ttp over right anterior lower leg). He exhibits no edema.  Neurological: He is alert and oriented to person, place, and time.  Skin: Skin is warm and dry.  Psychiatric: He has a normal mood and affect. His behavior is normal.  Nursing note and vitals reviewed.     Assessment & Plan:   Problem List Items Addressed This Visit    None    Visit Diagnoses    Right leg pain    -  Primary   Will focus on pain control to get him through until his orthopedic appt Friday. Small amount of hydrocodone given, and naproxen refilled       Follow up plan: Return if symptoms worsen or fail to improve.

## 2016-07-28 ENCOUNTER — Encounter: Payer: Self-pay | Admitting: Family Medicine

## 2016-07-28 ENCOUNTER — Ambulatory Visit (INDEPENDENT_AMBULATORY_CARE_PROVIDER_SITE_OTHER): Payer: Medicare Other | Admitting: Family Medicine

## 2016-07-28 ENCOUNTER — Other Ambulatory Visit: Payer: Self-pay | Admitting: Family Medicine

## 2016-07-28 VITALS — BP 160/71 | HR 97 | Temp 97.9°F | Wt 228.0 lb

## 2016-07-28 DIAGNOSIS — E1143 Type 2 diabetes mellitus with diabetic autonomic (poly)neuropathy: Secondary | ICD-10-CM | POA: Diagnosis not present

## 2016-07-28 DIAGNOSIS — M79604 Pain in right leg: Secondary | ICD-10-CM | POA: Diagnosis not present

## 2016-07-28 DIAGNOSIS — I10 Essential (primary) hypertension: Secondary | ICD-10-CM | POA: Diagnosis not present

## 2016-07-28 LAB — MICROALBUMIN, URINE WAIVED
Creatinine, Urine Waived: 50 mg/dL (ref 10–300)
Microalb, Ur Waived: 10 mg/L (ref 0–19)

## 2016-07-28 LAB — BAYER DCA HB A1C WAIVED: HB A1C (BAYER DCA - WAIVED): 6.6 % (ref <7.0)

## 2016-07-28 NOTE — Assessment & Plan Note (Signed)
Continued ongoing leg pain has appointment with orthopedics tomorrow we'll defer further management and treatment to them discuss use of nonsteroidals patient will hold and we will check BMP today. Discussed pain treatment use of Tylenol limiting use of narcotics due to multiple risk and popular opinion.

## 2016-07-28 NOTE — Progress Notes (Signed)
BP (!) 160/71   Pulse 97   Temp 97.9 F (36.6 C)   Wt 228 lb (103.4 kg)   SpO2 99%   BMI 32.71 kg/m    Subjective:    Patient ID: Eugene Clements, male    DOB: 04-Sep-1941, 75 y.o.   MRN: [id number]  HPI: Eugene Clements is a 75 y.o. male  Chief Complaint  Patient presents with  . Diabetes   Diabetes doing well noted low blood sugar spells taking medications faithfully without problems Blood pressure cholesterol no issues taking medications faithfully Patient's biggest concern is ongoing right leg pain discomfort spent ongoing for about 2 months now with continued pain. Due to misunderstanding was taking both Naprosyn and meloxicam together. Patient with no complaints of GI toxicity or renal function complaints.    Relevant past medical, surgical, family and social history reviewed and updated as indicated. Interim medical history since our last visit reviewed. Allergies and medications reviewed and updated.  Review of Systems  Constitutional: Negative.   Respiratory: Negative.   Cardiovascular: Negative.     Per HPI unless specifically indicated above     Objective:    BP (!) 160/71   Pulse 97   Temp 97.9 F (36.6 C)   Wt 228 lb (103.4 kg)   SpO2 99%   BMI 32.71 kg/m   Wt Readings from Last 3 Encounters:  07/28/16 228 lb (103.4 kg)  07/25/16 230 lb (104.3 kg)  07/22/16 240 lb (108.9 kg)    Physical Exam  Constitutional: He is oriented to person, place, and time. He appears well-developed and well-nourished. No distress.  HENT:  Head: Normocephalic and atraumatic.  Right Ear: Hearing normal.  Left Ear: Hearing normal.  Nose: Nose normal.  Eyes: Conjunctivae and lids are normal. Right eye exhibits no discharge. Left eye exhibits no discharge. No scleral icterus.  Cardiovascular: Normal rate, regular rhythm and normal heart sounds.   Pulmonary/Chest: Effort normal and breath sounds normal. No respiratory distress.  Musculoskeletal: Normal range of  motion.  Leg exam normal with normal diabetic foot exam  Neurological: He is alert and oriented to person, place, and time.  Skin: Skin is intact. No rash noted.  Psychiatric: He has a normal mood and affect. His speech is normal and behavior is normal. Judgment and thought content normal. Cognition and memory are normal.    Results for orders placed or performed in visit on 06/22/16  HM DIABETES EYE EXAM  Result Value Ref Range   HM Diabetic Eye Exam No Retinopathy No Retinopathy      Assessment & Plan:   Problem List Items Addressed This Visit      Cardiovascular and Mediastinum   Essential hypertension, benign    The current medical regimen is effective;  continue present plan and medications.       Relevant Orders   Basic metabolic panel     Endocrine   Type 2 diabetes mellitus with neurologic complication, without long-term current use of insulin (HCC) - Primary    The current medical regimen is effective;  continue present plan and medications.       Relevant Orders   Bayer DCA Hb A1c Waived   Microalbumin, Urine Waived   Basic metabolic panel     Other   Leg pain, anterior, right    Continued ongoing leg pain has appointment with orthopedics tomorrow we'll defer further management and treatment to them discuss use of nonsteroidals patient will hold and we will check  BMP today. Discussed pain treatment use of Tylenol limiting use of narcotics due to multiple risk and popular opinion.        Other Visit Diagnoses   None.      Follow up plan: Return in about 3 months (around 10/28/2016) for Hemoglobin A1c, BMP,  Lipids, ALT, AST.

## 2016-07-28 NOTE — Assessment & Plan Note (Signed)
The current medical regimen is effective;  continue present plan and medications.  

## 2016-07-29 LAB — BASIC METABOLIC PANEL
BUN / CREAT RATIO: 19 (ref 10–24)
BUN: 13 mg/dL (ref 8–27)
CO2: 23 mmol/L (ref 18–29)
CREATININE: 0.68 mg/dL — AB (ref 0.76–1.27)
Calcium: 9.7 mg/dL (ref 8.6–10.2)
Chloride: 89 mmol/L — ABNORMAL LOW (ref 96–106)
GFR calc Af Amer: 109 mL/min/{1.73_m2} (ref >59)
GFR calc non Af Amer: 94 mL/min/{1.73_m2} (ref >59)
GLUCOSE: 114 mg/dL — AB (ref 65–99)
Potassium: 4.7 mmol/L (ref 3.5–5.2)
Sodium: 126 mmol/L — ABNORMAL LOW (ref 134–144)

## 2016-08-01 ENCOUNTER — Telehealth: Payer: Self-pay | Admitting: Family Medicine

## 2016-08-01 NOTE — Telephone Encounter (Signed)
Phone call Discussed with patient low sodium patient drinking a lot of water discussed cutting back on fluids recheck next office visit with BMP.

## 2016-08-29 ENCOUNTER — Other Ambulatory Visit: Payer: Self-pay | Admitting: Family Medicine

## 2016-08-29 NOTE — Telephone Encounter (Signed)
Routing to provider  

## 2016-09-07 ENCOUNTER — Other Ambulatory Visit: Payer: Self-pay | Admitting: Family Medicine

## 2016-09-07 DIAGNOSIS — N138 Other obstructive and reflux uropathy: Secondary | ICD-10-CM

## 2016-09-07 DIAGNOSIS — N401 Enlarged prostate with lower urinary tract symptoms: Principal | ICD-10-CM

## 2016-09-07 NOTE — Telephone Encounter (Signed)
Routing to provider  

## 2016-09-08 ENCOUNTER — Telehealth: Payer: Self-pay

## 2016-09-08 NOTE — Telephone Encounter (Signed)
Please send a new prescription for patients buspirone 15mg , QUANITY OF 360 for a 90 day supply

## 2016-09-12 MED ORDER — BUSPIRONE HCL 15 MG PO TABS: 30.0000 mg | ORAL_TABLET | Freq: Two times a day (BID) | ORAL | 1 refills | Status: AC

## 2016-09-26 ENCOUNTER — Other Ambulatory Visit: Payer: Self-pay | Admitting: Family Medicine

## 2016-10-20 ENCOUNTER — Encounter: Payer: Self-pay | Admitting: Family Medicine

## 2016-10-20 ENCOUNTER — Ambulatory Visit (INDEPENDENT_AMBULATORY_CARE_PROVIDER_SITE_OTHER): Payer: Medicare Other | Admitting: Family Medicine

## 2016-10-20 VITALS — BP 136/80 | HR 80 | Temp 97.6°F | Ht 70.0 in | Wt 220.0 lb

## 2016-10-20 DIAGNOSIS — E7800 Pure hypercholesterolemia, unspecified: Secondary | ICD-10-CM | POA: Diagnosis not present

## 2016-10-20 DIAGNOSIS — I10 Essential (primary) hypertension: Secondary | ICD-10-CM | POA: Diagnosis not present

## 2016-10-20 DIAGNOSIS — E1143 Type 2 diabetes mellitus with diabetic autonomic (poly)neuropathy: Secondary | ICD-10-CM

## 2016-10-20 LAB — LP+ALT+AST PICCOLO, WAIVED
ALT (SGPT) PICCOLO, WAIVED: 20 U/L (ref 10–47)
AST (SGOT) PICCOLO, WAIVED: 32 U/L (ref 11–38)
CHOL/HDL RATIO PICCOLO,WAIVE: 2 mg/dL
Cholesterol Piccolo, Waived: 94 mg/dL (ref <200)
HDL CHOL PICCOLO, WAIVED: 46 mg/dL — AB (ref >59)
LDL Chol Calc Piccolo Waived: 32 mg/dL (ref <100)
TRIGLYCERIDES PICCOLO,WAIVED: 79 mg/dL (ref <150)
VLDL CHOL CALC PICCOLO,WAIVE: 16 mg/dL (ref <30)

## 2016-10-20 LAB — BAYER DCA HB A1C WAIVED: HB A1C (BAYER DCA - WAIVED): 6.3 % (ref <7.0)

## 2016-10-20 MED ORDER — PREGABALIN 50 MG PO CAPS: 50.0000 mg | ORAL_CAPSULE | Freq: Two times a day (BID) | ORAL | 2 refills | Status: AC

## 2016-10-20 NOTE — Progress Notes (Signed)
BP 136/80 (BP Location: Left Arm)   Pulse 80   Temp 97.6 F (36.4 C) (Oral)   Ht 5\' 10"  (1.778 m)   Wt 220 lb (99.8 kg)   SpO2 97%   BMI 31.57 kg/m    Subjective:    Patient ID: Eugene Clements, male    DOB: 07-21-1941, 76 y.o.   MRN: [id number]  HPI: Eugene Clements is a 76 y.o. male  Chief Complaint  Patient presents with  . Follow-up  . Diabetes  . Hypertension  . 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 Surgery did not resolve symptoms at all. His been able to cut down oxycodone to 5 mg once a day. Took Lyrica previously and did help some took it for 2 weeks. Is taking Mirapex with unknown affects is taking Cymbalta 60 mg helped at first but not known if doing anything now.  Relevant past medical, surgical, family and social history reviewed and updated as indicated. Interim medical history since our last visit reviewed. Allergies and medications reviewed and updated.  Review of Systems  Constitutional: Negative.   Respiratory: Negative.   Cardiovascular: Negative.     Per HPI unless specifically indicated above     Objective:    BP 136/80 (BP Location: Left Arm)   Pulse 80   Temp 97.6 F (36.4 C) (Oral)   Ht 5\' 10"  (1.778 m)   Wt 220 lb (99.8 kg)   SpO2 97%   BMI 31.57 kg/m   Wt Readings from Last 3 Encounters:  10/20/16 220 lb (99.8 kg)  07/28/16 228 lb (103.4 kg)  07/25/16 230 lb (104.3 kg)    Physical Exam  Constitutional: He is oriented to person, place, and time. He appears well-developed and well-nourished. No distress.  HENT:  Head: Normocephalic and atraumatic.  Right Ear: Hearing normal.  Left Ear: Hearing normal.  Nose: Nose normal.  Eyes: Conjunctivae and lids are normal. Right eye exhibits no discharge. Left eye exhibits no discharge. No scleral icterus.  Cardiovascular: Normal rate, regular rhythm  and normal heart sounds.   Pulmonary/Chest: Effort normal and breath sounds normal. No respiratory distress.  Musculoskeletal: Normal range of motion.  Neurological: He is alert and oriented to person, place, and time.  Skin: Skin is intact. No rash noted.  Normal appearance of feet and normal pinprick sensation but feet burn and tingle  Psychiatric: He has a normal mood and affect. His speech is normal and behavior is normal. Judgment and thought content normal. Cognition and memory are normal.    Results for orders placed or performed in visit on 07/28/16  Bayer DCA Hb A1c Waived  Result Value Ref Range   Bayer DCA Hb A1c Waived 6.6 <7.0 %  Microalbumin, Urine Waived  Result Value Ref Range   Microalb, Ur Waived 10 0 - 19 mg/L   Creatinine, Urine Waived 50 10 - 300 mg/dL   Microalb/Creat Ratio 30-300 (H) <30 mg/g  Basic metabolic panel  Result Value Ref Range   Glucose 114 (H) 65 - 99 mg/dL   BUN 13 8 - 27 mg/dL   Creatinine, Ser 4.09 (L) 0.76 - 1.27 mg/dL   GFR calc non Af Amer 94 >59 mL/min/1.73   GFR calc Af Amer 109 >59 mL/min/1.73   BUN/Creatinine Ratio 19 10 - 24   Sodium 126 (L) 134 -  144 mmol/L   Potassium 4.7 3.5 - 5.2 mmol/L   Chloride 89 (L) 96 - 106 mmol/L   CO2 23 18 - 29 mmol/L   Calcium 9.7 8.6 - 10.2 mg/dL      Assessment & Plan:   Problem List Items Addressed This Visit      Cardiovascular and Mediastinum   Essential hypertension, benign - Primary   Relevant Orders   Basic metabolic panel   LP+ALT+AST Piccolo, Waived     Endocrine   Type 2 diabetes mellitus with neurologic complication, without long-term current use of insulin (HCC)    Reviewed patient's neuropathy. Because of ongoing complications and poor response to surgery and medications will refer to neurology to further evaluate and consider treatment. Because of nonresponse to Mirapex will discontinue Reviewed use of Lyrica and oxycodone together and cautions about central nervous system  depression given to patient and observation but based on patient's history of taking this previously without problems and decreased dose of oxycodone since that time will prescribe low-dose 50 mg twice a day with no increase in the meantime.      Relevant Medications   pregabalin (LYRICA) 50 MG capsule   Other Relevant Orders   Bayer DCA Hb A1c Waived   Basic metabolic panel   LP+ALT+AST Piccolo, Waived   Ambulatory referral to Neurology     Other   Hypercholesterolemia   Relevant Orders   Basic metabolic panel   LP+ALT+AST Piccolo, Waived       Follow up plan: Return in about 2 months (around 12/18/2016).

## 2016-10-20 NOTE — Assessment & Plan Note (Addendum)
Reviewed patient's neuropathy. Because of ongoing complications and poor response to surgery and medications will refer to neurology to further evaluate and consider treatment. Because of nonresponse to Mirapex will discontinue Reviewed use of Lyrica and oxycodone together and cautions about central nervous system depression given to patient and observation but based on patient's history of taking this previously without problems and decreased dose of oxycodone since that time will prescribe low-dose 50 mg twice a day with no increase in the meantime. Diabetes indicating good control with hemoglobin A1c of 6.3 today.

## 2016-10-21 LAB — BASIC METABOLIC PANEL
BUN / CREAT RATIO: 23 (ref 10–24)
BUN: 15 mg/dL (ref 8–27)
CO2: 23 mmol/L (ref 18–29)
CREATININE: 0.65 mg/dL — AB (ref 0.76–1.27)
Calcium: 9 mg/dL (ref 8.6–10.2)
Chloride: 93 mmol/L — ABNORMAL LOW (ref 96–106)
GFR, EST AFRICAN AMERICAN: 111 mL/min/{1.73_m2} (ref >59)
GFR, EST NON AFRICAN AMERICAN: 96 mL/min/{1.73_m2} (ref >59)
Glucose: 106 mg/dL — ABNORMAL HIGH (ref 65–99)
Potassium: 4.3 mmol/L (ref 3.5–5.2)
Sodium: 132 mmol/L — ABNORMAL LOW (ref 134–144)

## 2016-10-24 ENCOUNTER — Encounter: Payer: Self-pay | Admitting: Family Medicine

## 2016-10-27 ENCOUNTER — Ambulatory Visit: Payer: Medicare Other | Admitting: Family Medicine

## 2016-10-28 ENCOUNTER — Telehealth: Payer: Self-pay | Admitting: Family Medicine

## 2016-10-28 NOTE — Telephone Encounter (Signed)
Patient called to check the status of his referral to a neurologist.  Thank [facility] Karen  [phone]

## 2016-10-28 NOTE — Telephone Encounter (Signed)
Patient notified of appointment.  

## 2016-11-04 ENCOUNTER — Other Ambulatory Visit: Payer: Self-pay | Admitting: Family Medicine

## 2016-11-04 DIAGNOSIS — N138 Other obstructive and reflux uropathy: Secondary | ICD-10-CM

## 2016-11-04 DIAGNOSIS — N401 Enlarged prostate with lower urinary tract symptoms: Principal | ICD-10-CM

## 2016-11-04 NOTE — Telephone Encounter (Signed)
Routing to provider. No follow up on file. 

## 2016-12-02 ENCOUNTER — Other Ambulatory Visit: Payer: Self-pay | Admitting: Family Medicine

## 2016-12-02 DIAGNOSIS — N138 Other obstructive and reflux uropathy: Secondary | ICD-10-CM

## 2016-12-02 DIAGNOSIS — N401 Enlarged prostate with lower urinary tract symptoms: Principal | ICD-10-CM

## 2016-12-02 NOTE — Telephone Encounter (Signed)
Routing to provider. No follow up on file. 

## 2016-12-11 ENCOUNTER — Other Ambulatory Visit: Payer: Self-pay | Admitting: Family Medicine

## 2017-01-03 ENCOUNTER — Other Ambulatory Visit: Payer: Self-pay | Admitting: Family Medicine

## 2017-01-03 DIAGNOSIS — N138 Other obstructive and reflux uropathy: Secondary | ICD-10-CM

## 2017-01-03 DIAGNOSIS — N401 Enlarged prostate with lower urinary tract symptoms: Principal | ICD-10-CM

## 2017-01-04 NOTE — Telephone Encounter (Signed)
Last routine OV: 10/20/16 Next OV: none on file.

## 2017-01-13 ENCOUNTER — Other Ambulatory Visit: Payer: Self-pay | Admitting: Family Medicine

## 2017-01-13 DIAGNOSIS — I10 Essential (primary) hypertension: Secondary | ICD-10-CM

## 2017-01-13 NOTE — Telephone Encounter (Signed)
Last OV: 10/20/16 Next OV: none on file.   BMP Latest Ref Rng & Units 10/20/2016 07/28/2016 04/27/2016  Glucose 65 - 99 mg/dL 161(W) 960(A) 540(J)  BUN 8 - 27 mg/dL Creatinine 0.76 - 1.27 mg/dL 8.11(B) 1.47(W) 2.95  BUN/Creat Ratio 10 - 27(H)  Sodium 134 - 144 mmol/L 132(L) 126(L) 128(L)  Potassium 3.5 - 5.2 mmol/L 4.3 4.7 4.7  Chloride 96 - 106 mmol/L 93(L) 89(L) 91(L)  CO2 18 - 29 mmol/L Calcium 8.6 - 10.2 mg/dL 9.0 9.7 9.5

## 2017-02-05 ENCOUNTER — Other Ambulatory Visit: Payer: Self-pay | Admitting: Family Medicine

## 2017-02-06 NOTE — Telephone Encounter (Signed)
apt 

## 2017-02-09 ENCOUNTER — Encounter: Payer: Self-pay | Admitting: Emergency Medicine

## 2017-02-09 ENCOUNTER — Emergency Department
Admission: EM | Admit: 2017-02-09 | Discharge: 2017-02-09 | Disposition: A | Discharge status: Home / Self Care | Payer: Medicare Other | Attending: Emergency Medicine | Admitting: Emergency Medicine

## 2017-02-09 ENCOUNTER — Emergency Department: Payer: Medicare Other

## 2017-02-09 DIAGNOSIS — E871 Hypo-osmolality and hyponatremia: Secondary | ICD-10-CM | POA: Diagnosis not present

## 2017-02-09 DIAGNOSIS — E119 Type 2 diabetes mellitus without complications: Secondary | ICD-10-CM | POA: Diagnosis not present

## 2017-02-09 DIAGNOSIS — I951 Orthostatic hypotension: Secondary | ICD-10-CM

## 2017-02-09 DIAGNOSIS — Z794 Long term (current) use of insulin: Secondary | ICD-10-CM | POA: Insufficient documentation

## 2017-02-09 DIAGNOSIS — R55 Syncope and collapse: Secondary | ICD-10-CM | POA: Diagnosis present

## 2017-02-09 DIAGNOSIS — Z87891 Personal history of nicotine dependence: Secondary | ICD-10-CM | POA: Insufficient documentation

## 2017-02-09 DIAGNOSIS — D500 Iron deficiency anemia secondary to blood loss (chronic): Secondary | ICD-10-CM | POA: Insufficient documentation

## 2017-02-09 DIAGNOSIS — I10 Essential (primary) hypertension: Secondary | ICD-10-CM | POA: Insufficient documentation

## 2017-02-09 DIAGNOSIS — Z7982 Long term (current) use of aspirin: Secondary | ICD-10-CM | POA: Diagnosis not present

## 2017-02-09 DIAGNOSIS — Z79899 Other long term (current) drug therapy: Secondary | ICD-10-CM | POA: Insufficient documentation

## 2017-02-09 DIAGNOSIS — D649 Anemia, unspecified: Secondary | ICD-10-CM

## 2017-02-09 LAB — BASIC METABOLIC PANEL
Anion gap: 7 (ref 5–15)
BUN: 18 mg/dL (ref 6–20)
CHLORIDE: 97 mmol/L — AB (ref 101–111)
CO2: 24 mmol/L (ref 22–32)
CREATININE: 0.63 mg/dL (ref 0.61–1.24)
Calcium: 8.9 mg/dL (ref 8.9–10.3)
GFR calc Af Amer: >60 mL/min (ref >60)
GFR calc non Af Amer: >60 mL/min (ref >60)
GLUCOSE: 122 mg/dL — AB (ref 65–99)
POTASSIUM: 3.9 mmol/L (ref 3.5–5.1)
SODIUM: 128 mmol/L — AB (ref 135–145)

## 2017-02-09 LAB — URINALYSIS, COMPLETE (UACMP) WITH MICROSCOPIC
BILIRUBIN URINE: NEGATIVE
Bacteria, UA: NONE SEEN
Glucose, UA: NEGATIVE mg/dL
Hgb urine dipstick: NEGATIVE
KETONES UR: NEGATIVE mg/dL
LEUKOCYTES UA: NEGATIVE
Nitrite: NEGATIVE
Protein, ur: NEGATIVE mg/dL
SPECIFIC GRAVITY, URINE: 1.011 (ref 1.005–1.030)
SQUAMOUS EPITHELIAL / LPF: NONE SEEN
pH: 6 (ref 5.0–8.0)

## 2017-02-09 LAB — TROPONIN I: Troponin I: <0.03 ng/mL (ref <0.03)

## 2017-02-09 LAB — CBC
HEMATOCRIT: 35.6 % — AB (ref 40.0–52.0)
Hemoglobin: 11.8 g/dL — ABNORMAL LOW (ref 13.0–18.0)
MCH: 24.8 pg — AB (ref 26.0–34.0)
MCHC: 33 g/dL (ref 32.0–36.0)
MCV: 75.2 fL — AB (ref 80.0–100.0)
PLATELETS: 296 10*3/uL (ref 150–440)
RBC: 4.74 MIL/uL (ref 4.40–5.90)
RDW: 17.4 % — AB (ref 11.5–14.5)
WBC: 5.2 10*3/uL (ref 3.8–10.6)

## 2017-02-09 LAB — GLUCOSE, CAPILLARY: Glucose-Capillary: 128 mg/dL — ABNORMAL HIGH (ref 65–99)

## 2017-02-09 MED ORDER — SODIUM CHLORIDE 0.9 % IV BOLUS (SEPSIS): 1000.0000 mL | Freq: Once | INTRAVENOUS | Status: DC

## 2017-02-09 MED ORDER — SODIUM CHLORIDE 0.9 % IV BOLUS (SEPSIS)
1000.0000 mL | Freq: Once | INTRAVENOUS | Status: AC
  Administered 2017-02-09: 1000 mL via INTRAVENOUS

## 2017-02-09 NOTE — ED Triage Notes (Signed)
Pt presents to ED via [facility] with c/o near syncope. EMS reports patient was dropping his grandson off at school when he fell onto his R side, denies any complaints or injury at this time. EMS reports BP on scene 170/90, 92HR, 99% on RA and CBG of 103, pt reports being given a "mixture of juices" on scene, on arrival CBG 128. Pt reports hasn't eaten today and is a diabetic. Pt is alert and oriented at this time, is noticeably anxious at this time.

## 2017-02-09 NOTE — ED Provider Notes (Signed)
[facility] Emergency Department Provider Note  ____________________________________________  Time seen: Approximately 8:59 AM  I have reviewed the triage vital signs and the nursing notes.   HISTORY  Chief Complaint Near Syncope    HPI Eugene Clements is a 76 y.o. male with a history of HTN, HL, DM, depression and bipolar disorder, presenting for presyncope. The patient reports that he has been under an overwhelming amount of stress over the last several weeks with personal and family illness, as well as relationship problems between his son and daughter-in-law.Due to his stress, he has not been eating, drinking, or sleeping well. He was dropping his grandchild at school, standing at a computer to check him in, when he developed lightheadedness and generalized weakness, and fell to the ground, striking his left upper arm. He did not lose consciousness and remembers everything that happened. He did not have any associated chest pain, shortness of breath, palpitations, visual changes, numbness tingling or weakness. He was able to get up on his own and feels back to baseline at this time.   Past Medical History:  Diagnosis Date  . BPH (benign prostatic hypertrophy) with urinary obstruction   . Depression   . Diabetes mellitus without complication (HCC)   . Diverticulitis   . Hyperlipidemia   . Hyponatremia   . Organic bipolar disorder (HCC)   . Sleep apnea     Patient Active Problem List   Diagnosis Date Noted  . Leg pain, anterior, right 07/28/2016  . Anemia, iron deficiency 02/25/2016  . BPH with obstruction/lower urinary tract symptoms 01/21/2016  . Depression 09/29/2015  . Hypercholesterolemia 06/29/2015  . Essential hypertension, benign 06/29/2015  . Generalized abdominal pain 03/12/2015  . Type 2 diabetes mellitus with neurologic complication, without long-term current use of insulin (HCC) 03/12/2015    Past Surgical History:  Procedure  Laterality Date  . CIRCUMCISION  2002  . TONSILLECTOMY      Current Outpatient Rx  . Order #: [id number] Class: Historical Med  . Order #: [id number] Class: Historical Med  . Order #: [id number] Class: Normal  . Order #: [id number] Class: Normal  . Order #: [id number] Class: Normal  . Order #: [id number] Class: Historical Med  . Order #: [id number] Class: Normal  . Order #: [id number] Class: Normal  . Order #: [id number] Class: Normal  . Order #: [id number] Class: Historical Med  . Order #: [id number] Class: Normal  . Order #: [id number] Class: Normal  . Order #: [id number] Class: Print  . Order #: [id number] Class: Normal  . Order #: [id number] Class: Print  . Order #: [id number] Class: Normal  . Order #: [id number] Class: Normal  . Order #: [id number] Class: Normal    Allergies Patient has no known allergies.  Family History  Problem Relation Age of Onset  . Cancer Mother     breast  . Diabetes Mother   . Hypertension Mother   . Cancer Father     prostate    Social History Social History  Substance Use Topics  . Smoking status: Former Smoker    Quit date: 06/28/1969  . Smokeless tobacco: Never Used  . Alcohol use 12.0 oz/week    10 Cans of beer, 10 Standard drinks or equivalent per week     Comment: Beer    Review of Systems Constitutional: No fever/chills.Positive lightheadedness. Positive fall without syncope. Eyes: No visual changes. No blurred or double vision. ENT: No sore throat. No congestion or rhinorrhea. Cardiovascular: Denies chest pain. Denies palpitations. Respiratory: Denies shortness of breath.  No cough. Gastrointestinal: No abdominal pain.  No  nausea, no vomiting.  No diarrhea.  No constipation. Genitourinary: Negative for dysuria. Musculoskeletal: Negative for new back pain. Mild left upper arm pain. Skin: Negative for rash. Neurological: Negative for headaches. No focal numbness, tingling or weakness. No visual changes, speech changes or mental status changes. Room  spinning sensation. No difficulty walking.  10-point ROS otherwise negative.  ____________________________________________   PHYSICAL EXAM:  VITAL SIGNS: ED Triage Vitals  Enc Vitals Group     BP 02/09/17 0854 (!) 193/90     Pulse Rate 02/09/17 0854 77     Resp 02/09/17 0854 18     Temp 02/09/17 0854 97.8 F (36.6 C)     Temp Source 02/09/17 0854 Oral     SpO2 02/09/17 0841 99 %     Weight 02/09/17 0855 235 lb (106.6 kg)     Height 02/09/17 0855 5\' 10"  (1.778 m)     Head Circumference --      Peak Flow --      Pain Score --      Pain Loc --      Pain Edu? --      Excl. in GC? --     Constitutional: Alert and oriented. Well appearing and in no acute distress. Answers questions appropriately. Eyes: Conjunctivae are normal.  EOMI. PERRLA. No scleral icterus. Head: Atraumatic. No raccoon eyes or Battle sign. Nose: No congestion/rhinnorhea. No swelling over the nose. No septal hematoma. Mouth/Throat: Mucous membranes are moist. No dental injury or malocclusion. Neck: No stridor.  Supple.  No midline C-spine tenderness to palpation, step-offs or deformities. Full range of motion without pain. No meningismus. Cardiovascular: Normal rate, regular rhythm. No murmurs, rubs or gallops.  Respiratory: Normal respiratory effort.  No accessory muscle use or retractions. Lungs CTAB.  No wheezes, rales or ronchi. Gastrointestinal: Overweight. Soft, nontender and nondistended.  No guarding or rebound.  No peritoneal signs. Musculoskeletal: Pelvis is stable. Moves all extremities well. Mild tenderness in the left upper arm without overlying bruising, swelling or skin abnormality. Full range of motion of the left shoulder and elbow without significant pain. Normal left radial pulse. No LE edema. No ttp in the calves or palpable cords.  Negative Homan's sign. Neurologic:  A&Ox3.  Speech is clear.  Face and smile are symmetric.  EOMI.  PERRLA. Moves all extremities well. Skin:  Skin is warm, dry  and intact. No rash noted. Psychiatric: Depressed mood and tearful during our examination.  ____________________________________________   LABS (all labs ordered are listed, but only abnormal results are displayed)  Labs Reviewed  BASIC METABOLIC PANEL - Abnormal; Notable for the following:       Result Value   Sodium 128 (*)    Chloride 97 (*)    Glucose, Bld 122 (*)    All other components within normal limits  CBC - Abnormal; Notable for the following:    Hemoglobin 11.8 (*)    HCT 35.6 (*)    MCV 75.2 (*)    MCH 24.8 (*)    RDW 17.4 (*)    All other components within normal limits  GLUCOSE, CAPILLARY - Abnormal; Notable for the following:    Glucose-Capillary 128 (*)    All other components within normal limits  TROPONIN I  URINALYSIS, COMPLETE (UACMP) WITH MICROSCOPIC  CBG MONITORING, ED   ____________________________________________  EKG  ED ECG REPORT I, Rockne MenghiniNorman, Anne-Caroline, the attending physician, personally viewed and interpreted this ECG.   Date: 02/09/2017  EKG Time: 844  Rate: 73  Rhythm: normal sinus rhythm  Axis: normal  Intervals:first-degree A-V block   ST&T Change: Nonspecific T-wave inversion in V1. No ST elevation.  ____________________________________________  RADIOLOGY  Dg Chest 2 View  Result Date: 02/09/2017 CLINICAL DATA:  Presyncope. EXAM: CHEST  2 VIEW COMPARISON:  None. FINDINGS: The heart size and mediastinal contours are within normal limits. Both lungs are clear. No pneumothorax or pleural effusion is noted. The visualized skeletal structures are unremarkable. IMPRESSION: No active cardiopulmonary disease. Electronically Signed   By: Lupita Raider, M.D.   On: 02/09/2017 10:15    ____________________________________________   PROCEDURES  Procedure(s) performed: None  Procedures  Critical Care performed: No ____________________________________________   INITIAL IMPRESSION / ASSESSMENT AND PLAN / ED COURSE  Pertinent  labs & imaging results that were available during my care of the patient were reviewed by me and considered in my medical decision making (see chart for details).  76 y.o. male with multiple chronic medical comorbidities, under significant amount of stress, presenting with presyncopal episode. Overall, the patient is mildly hypertensive here, but has no focal cardiopulmonary or neurologic abnormalities on my examination. His EKG does not show ischemic changes or arrhythmia. I'll get basic blood work, Barista, and reevaluate the patient for final disposition. It is possible that the patient had a vagal episode, or is mildly hypovolemic in the setting of his recent stress and decreased by mouth intake.  ----------------------------------------- 10:25 AM on 02/09/2017 -----------------------------------------  Overall, the patient's course is reassuring in the emergency department. He is completely asymptomatic sign. Laboratory studies reveal a hyponatremia with sodium of 128. This is a chronic issue for him, he has received intravenous fluids here. He will follow-up with his primary care physician is rechecked. Patient has a baseline anemia which is unchanged. His white blood cell count is normal. His chest x-ray does not show any acute cardiopulmonary process. From sitting to standing, the patient did have drop his blood pressure initially, this will be rechecked prior to discharge. I anticipate discharge with close PMD follow-up. Return were discussed..  ____________________________________________  FINAL CLINICAL IMPRESSION(S) / ED DIAGNOSES  Final diagnoses:  Orthostasis  Near syncope  Hyponatremia  Chronic anemia         NEW MEDICATIONS STARTED DURING THIS VISIT:  New Prescriptions   No medications on file      Rockne Menghini, MD 02/09/17 [phone]

## 2017-02-09 NOTE — ED Notes (Signed)
Warm blanket given per patient request, pt also given a urinal, this RN stepped out for patient privacy. Pt's son remains at bedside, will continue to monitor for further patient needs.

## 2017-02-09 NOTE — ED Notes (Signed)
This RN to bedside. Pt visualized in NAD. This Rn explained to patient that per MD pt was to get a full 1L NS, then have orthostatic VS checked prior to D/C. Pt and son state understanding, at this time pt has approx 500cc left in 1L bolus. Pt instructed to keep his arm straight at this time due to IV placement.

## 2017-02-09 NOTE — Discharge Instructions (Signed)
Please drink plenty of fluid to stay well hydrated.  Eat small regular meals throughout the day and get plenty of rest.  Return to the emergency department if you develop severe pain, chest pain, shortness of breath, palpitations, numbness tingling or weakness, fever, or any other symptoms concerning to you.

## 2017-02-17 ENCOUNTER — Other Ambulatory Visit: Payer: Self-pay | Admitting: Family Medicine

## 2017-02-17 NOTE — Telephone Encounter (Signed)
Last OV: 10/20/16 Next OV: none on file.    Lab Results  Component Value Date   HGBA1C 6.2 04/27/2016   HGBA1C 6.7 04/27/2016

## 2017-02-27 ENCOUNTER — Other Ambulatory Visit: Payer: Self-pay | Admitting: Family Medicine

## 2017-02-27 NOTE — Telephone Encounter (Signed)
Last (acute) OV:  Last routine OV: 10/20/16 Next OV: None on file.

## 2017-03-10 ENCOUNTER — Telehealth: Payer: Self-pay | Admitting: Family Medicine

## 2017-03-13 NOTE — Telephone Encounter (Signed)
Needs check further refills 

## 2017-03-13 NOTE — Telephone Encounter (Signed)
Last OV: 10/20/16 Next OV: none on file.    Lab Results  Component Value Date   HGBA1C 6.2 04/27/2016   HGBA1C 6.7 04/27/2016

## 2017-03-14 NOTE — Telephone Encounter (Signed)
Needs appointment for further refills after this one.

## 2017-03-15 NOTE — Telephone Encounter (Signed)
Pt is no longer coming to [facility], due to insurance he is now going to [facility].

## 2017-03-15 NOTE — Telephone Encounter (Signed)
Noted  

## 2017-04-12 ENCOUNTER — Encounter: Payer: Self-pay | Admitting: Emergency Medicine

## 2017-04-12 ENCOUNTER — Ambulatory Visit
Admission: EM | Admit: 2017-04-12 | Discharge: 2017-04-12 | Disposition: A | Discharge status: Home / Self Care | Payer: Medicare Other | Attending: Family Medicine | Admitting: Family Medicine

## 2017-04-12 DIAGNOSIS — E860 Dehydration: Secondary | ICD-10-CM | POA: Diagnosis not present

## 2017-04-12 DIAGNOSIS — R42 Dizziness and giddiness: Secondary | ICD-10-CM

## 2017-04-12 DIAGNOSIS — E871 Hypo-osmolality and hyponatremia: Secondary | ICD-10-CM | POA: Diagnosis not present

## 2017-04-12 LAB — COMPREHENSIVE METABOLIC PANEL
ALT: 17 U/L (ref 17–63)
AST: 21 U/L (ref 15–41)
Albumin: 3.9 g/dL (ref 3.5–5.0)
Alkaline Phosphatase: 66 U/L (ref 38–126)
Anion gap: 7 (ref 5–15)
BUN: 19 mg/dL (ref 6–20)
CO2: 22 mmol/L (ref 22–32)
Calcium: 8.7 mg/dL — ABNORMAL LOW (ref 8.9–10.3)
Chloride: 100 mmol/L — ABNORMAL LOW (ref 101–111)
Creatinine, Ser: 0.68 mg/dL (ref 0.61–1.24)
GFR calc Af Amer: >60 mL/min (ref >60)
GFR calc non Af Amer: >60 mL/min (ref >60)
Glucose, Bld: 122 mg/dL — ABNORMAL HIGH (ref 65–99)
Potassium: 4.1 mmol/L (ref 3.5–5.1)
Sodium: 129 mmol/L — ABNORMAL LOW (ref 135–145)
Total Bilirubin: 0.4 mg/dL (ref 0.3–1.2)
Total Protein: 6.4 g/dL — ABNORMAL LOW (ref 6.5–8.1)

## 2017-04-12 LAB — CBC WITH DIFFERENTIAL/PLATELET
Basophils Absolute: 0.1 10*3/uL (ref 0–0.1)
Basophils Relative: 1 %
Eosinophils Absolute: 0.2 10*3/uL (ref 0–0.7)
Eosinophils Relative: 2 %
HCT: 33.5 % — ABNORMAL LOW (ref 40.0–52.0)
Hemoglobin: 11.1 g/dL — ABNORMAL LOW (ref 13.0–18.0)
Lymphocytes Relative: 15 %
Lymphs Abs: 1.3 10*3/uL (ref 1.0–3.6)
MCH: 24.9 pg — ABNORMAL LOW (ref 26.0–34.0)
MCHC: 33.1 g/dL (ref 32.0–36.0)
MCV: 75.1 fL — ABNORMAL LOW (ref 80.0–100.0)
Monocytes Absolute: 0.8 10*3/uL (ref 0.2–1.0)
Monocytes Relative: 9 %
Neutro Abs: 6.5 10*3/uL (ref 1.4–6.5)
Neutrophils Relative %: 73 %
Platelets: 256 10*3/uL (ref 150–440)
RBC: 4.46 MIL/uL (ref 4.40–5.90)
RDW: 16.4 % — ABNORMAL HIGH (ref 11.5–14.5)
WBC: 8.8 10*3/uL (ref 3.8–10.6)

## 2017-04-12 LAB — URINALYSIS, COMPLETE (UACMP) WITH MICROSCOPIC
Bacteria, UA: NONE SEEN
Bilirubin Urine: NEGATIVE
Glucose, UA: NEGATIVE mg/dL
Hgb urine dipstick: NEGATIVE
Ketones, ur: NEGATIVE mg/dL
Leukocytes, UA: NEGATIVE
Nitrite: NEGATIVE
Protein, ur: NEGATIVE mg/dL
RBC / HPF: NONE SEEN RBC/hpf (ref 0–5)
Specific Gravity, Urine: 1.015 (ref 1.005–1.030)
Squamous Epithelial / LPF: NONE SEEN
pH: 5.5 (ref 5.0–8.0)

## 2017-04-12 MED ORDER — SODIUM CHLORIDE 0.9 % IV SOLN
Freq: Once | INTRAVENOUS | Status: AC
  Administered 2017-04-12: 1000 mL via INTRAVENOUS

## 2017-04-12 NOTE — ED Provider Notes (Signed)
CSN: [id number]     Arrival date & time 04/12/17  1222 History   First MD Initiated Contact with Patient 04/12/17 1312     Chief Complaint  Patient presents with  . Dizziness   (Consider location/radiation/quality/duration/timing/severity/associated sxs/prior Treatment) HPI  This a 76 year old man who is well-known to our practice with a past medical history of type 2 diabetes mellitus hypercholesterolemia, hypertension, BPH and iron deficiency anemia. He has had a history of hyponatremia. His medical records reveals in May 2018 he was seen in the emergency department with similar symptoms and presentation ; found to have hyponatremia given IV fluids and discharged with salt tablets. He takes these very intermittently. Complaint today is that it has several episodes of dizziness and feeling weak .In May he had a presyncopal episode. He's had several episodes since the May presyncopal problem. He states he has been followed by his primary care physician as recent as last month. His wife states that he is walking outside  which has made him feel better since his back surgery. He states he does drink water but perhaps not enough. He takes his salt tablets only when he feels symptomatic. He denies any presyncope or syncope with this episode just a feeling of dizzy and weakness. He is afebrile pulse 79 blood pressure 143/72 respirations 18 and O2 sats 98% on room air.        Past Medical History:  Diagnosis Date  . BPH (benign prostatic hypertrophy) with urinary obstruction   . Depression   . Diabetes mellitus without complication (HCC)   . Diverticulitis   . Hyperlipidemia   . Hyponatremia   . Organic bipolar disorder (HCC)   . Sleep apnea    Past Surgical History:  Procedure Laterality Date  . BACK SURGERY    . CIRCUMCISION  2002  . TONSILLECTOMY     Family History  Problem Relation Age of Onset  . Cancer Mother        breast  . Diabetes Mother   . Hypertension Mother   . Cancer  Father        prostate   Social History  Substance Use Topics  . Smoking status: Former Smoker    Quit date: 06/28/1969  . Smokeless tobacco: Never Used  . Alcohol use 12.0 oz/week    10 Cans of beer, 10 Standard drinks or equivalent per week     Comment: Beer    Review of Systems  Constitutional: Positive for activity change. Negative for chills, fatigue and fever.  Neurological: Positive for dizziness and weakness.  All other systems reviewed and are negative.   Allergies  Patient has no known allergies.  Home Medications   Prior to Admission medications   Medication Sig Start Date End Date Taking? Authorizing Provider  aspirin EC 81 MG tablet Take 81 mg by mouth daily.   Yes [provider]  atorvastatin (LIPITOR) 20 MG tablet TAKE 1 TABLET BY MOUTH ONCE DAILY. 02/06/17  Yes [facility], Redge [facility]ainer, MD  benazepril (LOTENSIN) 40 MG tablet TAKE 1 TABLET BY MOUTH ONCE DAILY. 01/16/17  Yes Johnson, Megan P, DO  busPIRone (BUSPAR) 15 MG tablet Take 2 tablets (30 mg total) by mouth 2 (two) times daily. 09/12/16  Yes [facility], Redge Gainer, MD  docusate sodium (COLACE) 100 MG capsule Take 100 mg by mouth at bedtime.   Yes [provider]  DULoxetine (CYMBALTA) 60 MG capsule TAKE 1 CAPSULE BY MOUTH DAILY 02/27/17  Yes [facility], Redge Gainer, MD  LEVEMIR  FLEXTOUCH 100 UNIT/ML Pen INJECT 20 TO 40 UNITS SUBCUTANEOUSLY AT BEDTIME. 02/17/17  Yes Particia NearingLane, Rachel Elizabeth, PA-C  metFORMIN (GLUCOPHAGE-XR) 500 MG 24 hr tablet TAKE 2 TABLETS BY MOUTH ONCE DAILY WITH BREAKFAST. 03/13/17  Yes Gabriel CirriWicker, Cheryl, NP  metFORMIN (GLUMETZA) 500 MG (MOD) 24 hr tablet Take 2 tablets (1,000 mg total) by mouth daily with breakfast. 01/21/16  Yes [facility], Redge GainerMark A, MD  Multiple Vitamin (MULTIVITAMIN) tablet Take 1 tablet by mouth daily.   Yes [provider]  naproxen (NAPROSYN) 500 MG tablet TAKE 1 TABLET BY MOUTH TWICE DAILY WITH MEALS Patient taking differently: TAKE 1 TABLET BY MOUTH DAILY 09/26/16  Yes Particia NearingLane,  Rachel Elizabeth, PA-C  omeprazole (PRILOSEC) 20 MG capsule TAKE 1 CAPSULE BY MOUTH TWICE DAILY 06/27/16  Yes [facility], Redge GainerMark A, MD  pregabalin (LYRICA) 50 MG capsule Take 1 capsule (50 mg total) by mouth 2 (two) times daily. Patient taking differently: Take 75 mg by mouth 3 (three) times daily.  10/20/16  Yes [facility] Siz[facility], Mark A, MD  tamsulosin (FLOMAX) 0.4 MG CAPS capsule TAKE 1 CAPSULE BY MOUTH ONCE DAILY 01/05/17  Yes Particia NearingLane, Rachel Elizabeth, PA-C  acetaminophen (TYLENOL) 500 MG tablet Take by mouth at bedtime.    [provider]  HYDROcodone-acetaminophen (NORCO/VICODIN) 5-325 MG tablet Take 1 tablet by mouth every 6 (six) hours as needed for moderate pain or severe pain. Do not drive while taking this medication. Do not take any other medications containing Tylenol while you're taking this medication. Patient not taking: Reported on 02/09/2017 07/25/16   Particia NearingLane, Rachel Elizabeth, PA-C  NOVOFINE 32G X 6 MM MISC USE TO INJECT INSULIN AT BEDTIME 10/30/15   Johnson, Megan P, DO  traZODone (DESYREL) 50 MG tablet Take 0.5-1 tablets (25-50 mg total) by mouth at bedtime as needed for sleep. Patient not taking: Reported on 02/09/2017 07/12/16   [facility] Siz[facility], Mark A, MD  UNIFINE PENTIPS 32G X 4 MM MISC USE TO INJECT INSULIN AT BEDTIME 12/12/16   Gabriel CirriWicker, Cheryl, NP   Meds Ordered and Administered this Visit   Medications  0.9 %  sodium chloride infusion ( Intravenous Stopped 04/12/17 1449)    BP (!) 143/72 (BP Location: Left Arm)   Pulse 79   Temp 98.1 F (36.7 C) (Oral)   Resp 18   Ht 5\' 10"  (1.778 m)   Wt 230 lb (104.3 kg)   SpO2 98%   BMI 33.00 kg/m  Orthostatic VS for the past 24 hrs:  BP- Lying Pulse- Lying BP- Sitting Pulse- Sitting BP- Standing at 0 minutes Pulse- Standing at 0 minutes  04/12/17 1441 132/71 74 132/77 78 133/73 73  04/12/17 1334 137/62 75 118/62 79 106/62 84    Physical Exam  Constitutional: He is oriented to person, place, and time. He appears well-developed and  well-nourished. No distress.  HENT:  Head: Normocephalic.  Eyes: EOM are normal. Pupils are equal, round, and reactive to light. Right eye exhibits no discharge. Left eye exhibits no discharge.  Neck: Normal range of motion.  Cardiovascular: Normal rate and regular rhythm.  Exam reveals no gallop and no friction rub.   No murmur heard. Pulmonary/Chest: Effort normal and breath sounds normal.  Abdominal: Soft. Bowel sounds are normal.  Musculoskeletal: Normal range of motion.  Neurological: He is alert and oriented to person, place, and time. No cranial nerve deficit.  Patient has no nystagmus.  Skin: Skin is warm and dry. He is not diaphoretic.  Psychiatric: He has a normal mood and affect. His  behavior is normal. Judgment and thought content normal.  Nursing note and vitals reviewed.   Urgent Care Course     Procedures (including critical care time)  Labs Review Labs Reviewed  CBC WITH DIFFERENTIAL/PLATELET - Abnormal; Notable for the following:       Result Value   Hemoglobin 11.1 (*)    HCT 33.5 (*)    MCV 75.1 (*)    MCH 24.9 (*)    RDW 16.4 (*)    All other components within normal limits  COMPREHENSIVE METABOLIC PANEL - Abnormal; Notable for the following:    Sodium 129 (*)    Chloride 100 (*)    Glucose, Bld 122 (*)    Calcium 8.7 (*)    Total Protein 6.4 (*)    All other components within normal limits  URINALYSIS, COMPLETE (UACMP) WITH MICROSCOPIC    Imaging Review No results found.   Visual Acuity Review  Right Eye Distance:   Left Eye Distance:   Bilateral Distance:    Right Eye Near:   Left Eye Near:    Bilateral Near:     Medications  0.9 %  sodium chloride infusion ( Intravenous Stopped 04/12/17 1449)  Patient felt much improvement after the first 500 mL of fluid. This was discontinued and he will continue fluid intake at home.    MDM   1. Dehydration   2. Hyponatremia   3. Dizziness    Discharge Medication List as of 04/12/2017  2:51 PM     Plan: 1. Test/x-ray results and diagnosis reviewed with patient 2. rx as per orders; risks, benefits, potential side effects reviewed with patient 3. Recommend supportive treatment with Adequate fluid intake while exercising. Recommended patient follow-up with his primary care physician for evaluation monitoring and treatment of his hyponatremia. If he has another episode should follow-up in the emergency department. Sodium remains the same as it was during his emergency room visit on 02/2017 4. F/u prn if symptoms worsen or don't improve     Lutricia Feil, PA-C 04/12/17 1533    Lutricia Feil, PA-C 04/12/17 1535

## 2017-04-12 NOTE — ED Triage Notes (Signed)
Patient states he has had several episodes of dizziness and feeling weak. States he had an episode in May and went to [facility] where he was told his sodium was low.  He has had several episodes since May.

## 2017-05-06 ENCOUNTER — Encounter: Payer: Self-pay | Admitting: *Deleted

## 2017-05-06 ENCOUNTER — Ambulatory Visit
Admission: EM | Admit: 2017-05-06 | Discharge: 2017-05-06 | Disposition: A | Discharge status: Home / Self Care | Payer: Medicare Other | Attending: Family | Admitting: Family

## 2017-05-06 ENCOUNTER — Ambulatory Visit (INDEPENDENT_AMBULATORY_CARE_PROVIDER_SITE_OTHER): Payer: Medicare Other

## 2017-05-06 DIAGNOSIS — W19XXXA Unspecified fall, initial encounter: Secondary | ICD-10-CM

## 2017-05-06 DIAGNOSIS — S59901A Unspecified injury of right elbow, initial encounter: Secondary | ICD-10-CM | POA: Diagnosis not present

## 2017-05-06 DIAGNOSIS — T148XXA Other injury of unspecified body region, initial encounter: Secondary | ICD-10-CM | POA: Diagnosis not present

## 2017-05-06 DIAGNOSIS — R2231 Localized swelling, mass and lump, right upper limb: Secondary | ICD-10-CM

## 2017-05-06 DIAGNOSIS — S52181A Other fracture of upper end of right radius, initial encounter for closed fracture: Secondary | ICD-10-CM

## 2017-05-06 NOTE — ED Triage Notes (Addendum)
Patient tripped and fell today injuring his right elbow. Bruising and swelling are visible at the medial side of the right elbow.

## 2017-05-06 NOTE — ED Provider Notes (Signed)
CSN: [id number]     Arrival date & time 05/06/17  1552 History   First MD Initiated Contact with Patient 05/06/17 1649     Chief Complaint  Patient presents with  . Elbow Injury   (Consider location/radiation/quality/duration/timing/severity/associated sxs/prior Treatment) Chief complaint of right elbow pain after falling on it yesterday. Was working on swimming pool and got 'tangled up' with equipment and fell on cement and hit right elbow.  As today has progressed, right elbow swelling has gotten worse. Notes bruise and more pain around the upper arm. Unable to flex.   No numbness, tingling in arms,chest pain,SOB   No fever, N, V.  No history of blood clot On aspirin.         Past Medical History:  Diagnosis Date  . BPH (benign prostatic hypertrophy) with urinary obstruction   . Depression   . Diabetes mellitus without complication (HCC)   . Diverticulitis   . Hyperlipidemia   . Hyponatremia   . Organic bipolar disorder (HCC)   . Sleep apnea    Past Surgical History:  Procedure Laterality Date  . BACK SURGERY    . CIRCUMCISION  2002  . TONSILLECTOMY     Family History  Problem Relation Age of Onset  . Cancer Mother        breast  . Diabetes Mother   . Hypertension Mother   . Cancer Father        prostate   Social History  Substance Use Topics  . Smoking status: Former Smoker    Quit date: 06/28/1969  . Smokeless tobacco: Never Used  . Alcohol use 12.0 oz/week    10 Cans of beer, 10 Standard drinks or equivalent per week     Comment: Beer    Review of Systems  Constitutional: Negative for chills and fever.  Respiratory: Negative for cough and shortness of breath.   Cardiovascular: Negative for chest pain and palpitations.  Gastrointestinal: Negative for nausea and vomiting.  Musculoskeletal: Positive for joint swelling.  Skin: Positive for color change.    Allergies  Patient has no known allergies.  Home Medications   Prior to Admission  medications   Medication Sig Start Date End Date Taking? Authorizing Provider  acetaminophen (TYLENOL) 500 MG tablet Take by mouth at bedtime.   Yes [provider]  aspirin EC 81 MG tablet Take 81 mg by mouth daily.   Yes [provider]  atorvastatin (LIPITOR) 20 MG tablet TAKE 1 TABLET BY MOUTH ONCE DAILY. 02/06/17  Yes [facility], Redge GainerMark A, MD  benazepril (LOTENSIN) 40 MG tablet TAKE 1 TABLET BY MOUTH ONCE DAILY. 01/16/17  Yes Johnson, Megan P, DO  busPIRone (BUSPAR) 15 MG tablet Take 2 tablets (30 mg total) by mouth 2 (two) times daily. 09/12/16  Yes [facility], Redge GainerMark A, MD  docusate sodium (COLACE) 100 MG capsule Take 100 mg by mouth at bedtime.   Yes [provider]  DULoxetine (CYMBALTA) 60 MG capsule TAKE 1 CAPSULE BY MOUTH DAILY 02/27/17  Yes [facility], Mark A, MD  LEVEMIR FLEXTOUCH 100 UNIT/ML Pen INJECT 20 TO 40 UNITS SUBCUTANEOUSLY AT BEDTIME. 02/17/17  Yes Particia NearingLane, Rachel Elizabeth, PA-C  metFORMIN (GLUCOPHAGE-XR) 500 MG 24 hr tablet TAKE 2 TABLETS BY MOUTH ONCE DAILY WITH BREAKFAST. 03/13/17  Yes Gabriel CirriWicker, Cheryl, NP  naproxen (NAPROSYN) 500 MG tablet TAKE 1 TABLET BY MOUTH TWICE DAILY WITH MEALS Patient taking differently: TAKE 1 TABLET BY MOUTH DAILY 09/26/16  Yes Particia NearingLane, Rachel Elizabeth, PA-C  NOVOFINE 32G X 6  MM MISC USE TO INJECT INSULIN AT BEDTIME 10/30/15  Yes Johnson, Megan P, DO  omeprazole (PRILOSEC) 20 MG capsule TAKE 1 CAPSULE BY MOUTH TWICE DAILY 06/27/16  Yes [facility], Redge GainerMark A, MD  pregabalin (LYRICA) 50 MG capsule Take 1 capsule (50 mg total) by mouth 2 (two) times daily. Patient taking differently: Take 75 mg by mouth 3 (three) times daily.  10/20/16  Yes [facility], Mark A, MD  tamsulosin (FLOMAX) 0.4 MG CAPS capsule TAKE 1 CAPSULE BY MOUTH ONCE DAILY 01/05/17  Yes Particia NearingLane, Rachel Elizabeth, PA-C  UNIFINE PENTIPS 32G X 4 MM MISC USE TO INJECT INSULIN AT BEDTIME 12/12/16  Yes Gabriel CirriWicker, Cheryl, NP  HYDROcodone-acetaminophen (NORCO/VICODIN) 5-325 MG tablet Take 1 tablet by  mouth every 6 (six) hours as needed for moderate pain or severe pain. Do not drive while taking this medication. Do not take any other medications containing Tylenol while you're taking this medication. Patient not taking: Reported on 02/09/2017 07/25/16   Particia NearingLane, Rachel Elizabeth, PA-C  metFORMIN (GLUMETZA) 500 MG (MOD) 24 hr tablet Take 2 tablets (1,000 mg total) by mouth daily with breakfast. 01/21/16   [facility] S[facility], Mark A, MD  Multiple Vitamin (MULTIVITAMIN) tablet Take 1 tablet by mouth daily.    [provider]  traZODone (DESYREL) 50 MG tablet Take 0.5-1 tablets (25-50 mg total) by mouth at bedtime as needed for sleep. Patient not taking: Reported on 02/09/2017 07/12/16   [facility] S[facility], Mark A, MD   Meds Ordered and Administered this Visit  Medications - No data to display  BP 124/70 (BP Location: Left Arm)   Pulse 76   Temp 98 F (36.7 C) (Oral)   Resp 16   SpO2 99%  No data found.   Physical Exam  Constitutional: He appears well-developed and well-nourished.  Cardiovascular: Regular rhythm and normal heart sounds.   Pulmonary/Chest: Effort normal and breath sounds normal. No respiratory distress. He has no wheezes. He has no rhonchi. He has no rales.  Musculoskeletal:       Right elbow: He exhibits decreased range of motion and swelling. He exhibits no effusion, no deformity and no laceration.  Erythema surrounding right olecranon and distal humerus. Skin intact.  No increase in warmth.  Pain and reduction in range of motion with flexion and extension.  Lymphadenopathy:       Head (left side): No submandibular and no preauricular adenopathy present.  Neurological: He is alert.  Skin: Skin is warm and dry.  Psychiatric: He has a normal mood and affect. His speech is normal and behavior is normal.  Vitals reviewed.   Urgent Care Course     Procedures (including critical care time)  Labs Review Labs Reviewed - No data to display  Imaging Review Dg Elbow Complete  Right  Result Date: 05/06/2017 CLINICAL DATA:  Injury.  Status post fall. EXAM: RIGHT ELBOW - COMPLETE 3+ VIEW COMPARISON:  None. FINDINGS: There is marked medial soft tissue swelling. Large joint effusion is identified. There is a bone fragment identified measuring 11 mm within the joint space anterior to the distal humerus. I suspected donor site is the lateral condyle. There is a nondisplaced fracture involving the radial head. IMPRESSION: 1. There is a large joint effusion likely secondary to lateral condyle fracture. The fracture fragment appears displaced and is in the anterior joint space. Suggest further evaluation with CT of the right elbow for better definition of extent of fractures. 2. Nondisplaced radial head fracture. Electronically Signed   By: Veronda Prudeaylor  Stroud M.D.  On: 05/06/2017 17:30      MDM   1. Hematoma   2. Other closed fracture of proximal end of right radius, initial encounter      Spoke with Dr. Bradly Chris, radiologist - x-ray showing radial fracture, and fragment. Advised CT scan. Advised patient to go emergency room tonight for orthopedic evaluation. preference was to go to [facility] emergency room, Patient complied and stated wife would drive him.    Allegra Grana, FNP 05/06/17 1743

## 2017-05-06 NOTE — Discharge Instructions (Signed)
Please elevate, ice.   [facility] go [facility].

## 2017-05-09 ENCOUNTER — Other Ambulatory Visit: Payer: Self-pay | Admitting: Family Medicine

## 2017-05-09 NOTE — Telephone Encounter (Signed)
apt 

## 2017-05-10 NOTE — Telephone Encounter (Signed)
This pt is no longer a pt of [facility] due to change in insurance.

## 2017-05-10 NOTE — Telephone Encounter (Signed)
Routing to provider. No follow up on file. 

## 2017-05-20 ENCOUNTER — Other Ambulatory Visit: Payer: Self-pay | Admitting: Family Medicine

## 2017-05-26 ENCOUNTER — Other Ambulatory Visit: Payer: Self-pay | Admitting: Family Medicine

## 2017-05-26 DIAGNOSIS — N138 Other obstructive and reflux uropathy: Secondary | ICD-10-CM

## 2017-05-26 DIAGNOSIS — N401 Enlarged prostate with lower urinary tract symptoms: Principal | ICD-10-CM

## 2017-06-06 ENCOUNTER — Other Ambulatory Visit: Payer: Self-pay | Admitting: Family Medicine

## 2017-06-06 DIAGNOSIS — N138 Other obstructive and reflux uropathy: Secondary | ICD-10-CM

## 2017-06-06 DIAGNOSIS — N401 Enlarged prostate with lower urinary tract symptoms: Principal | ICD-10-CM

## 2017-06-21 ENCOUNTER — Other Ambulatory Visit: Payer: Self-pay | Admitting: Unknown Physician Specialty

## 2017-06-30 ENCOUNTER — Other Ambulatory Visit: Payer: Self-pay | Admitting: Family Medicine

## 2017-06-30 NOTE — Telephone Encounter (Signed)
Routing to provider  

## 2017-07-24 ENCOUNTER — Other Ambulatory Visit: Payer: Self-pay | Admitting: Family Medicine

## 2017-07-31 ENCOUNTER — Other Ambulatory Visit: Payer: Self-pay | Admitting: Family Medicine

## 2017-08-01 NOTE — Telephone Encounter (Signed)
As of 03/10/17 this pt is [facility].

## 2017-09-06 ENCOUNTER — Other Ambulatory Visit: Payer: Self-pay | Admitting: Family Medicine

## 2017-09-06 NOTE — Telephone Encounter (Signed)
This is not a current pt.

## 2017-09-06 NOTE — Telephone Encounter (Signed)
Wasn't sure whether to refill this or not.     Not sure if he is still a pt at this practice or not.  Thanks

## 2017-09-09 IMAGING — CR DG FOOT COMPLETE 3+V*L*
3 series · 3 of 3 positions shown · non-contrast
Comparison: None.

CLINICAL DATA: Stepped on toothpick 5 days prior. Plantar wound
under the fourth metatarsal head.

EXAM:
LEFT FOOT - COMPLETE 3+ VIEW

[foot ap]
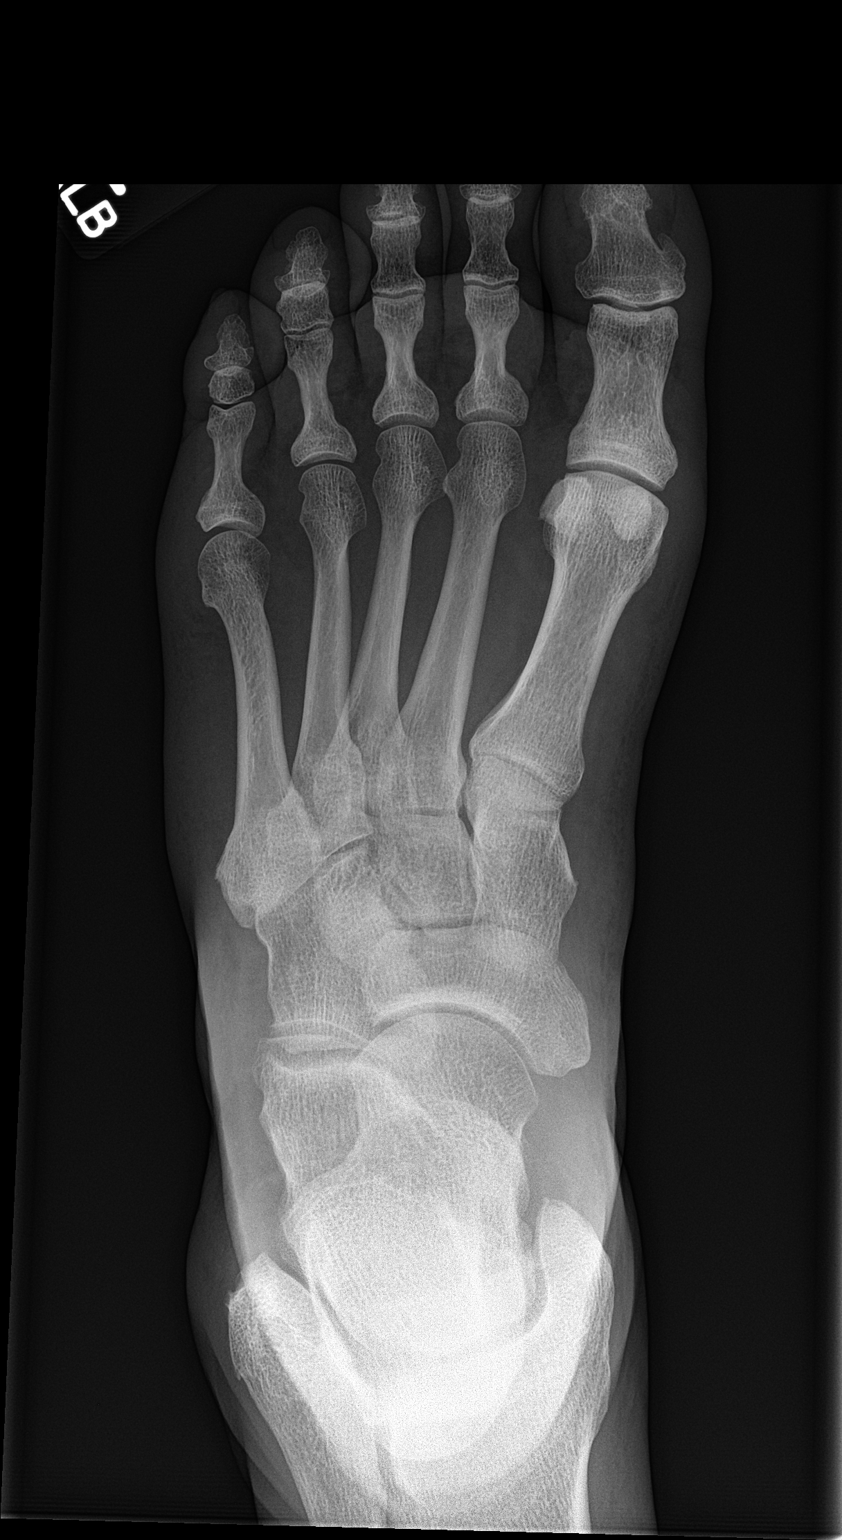

[foot obl]
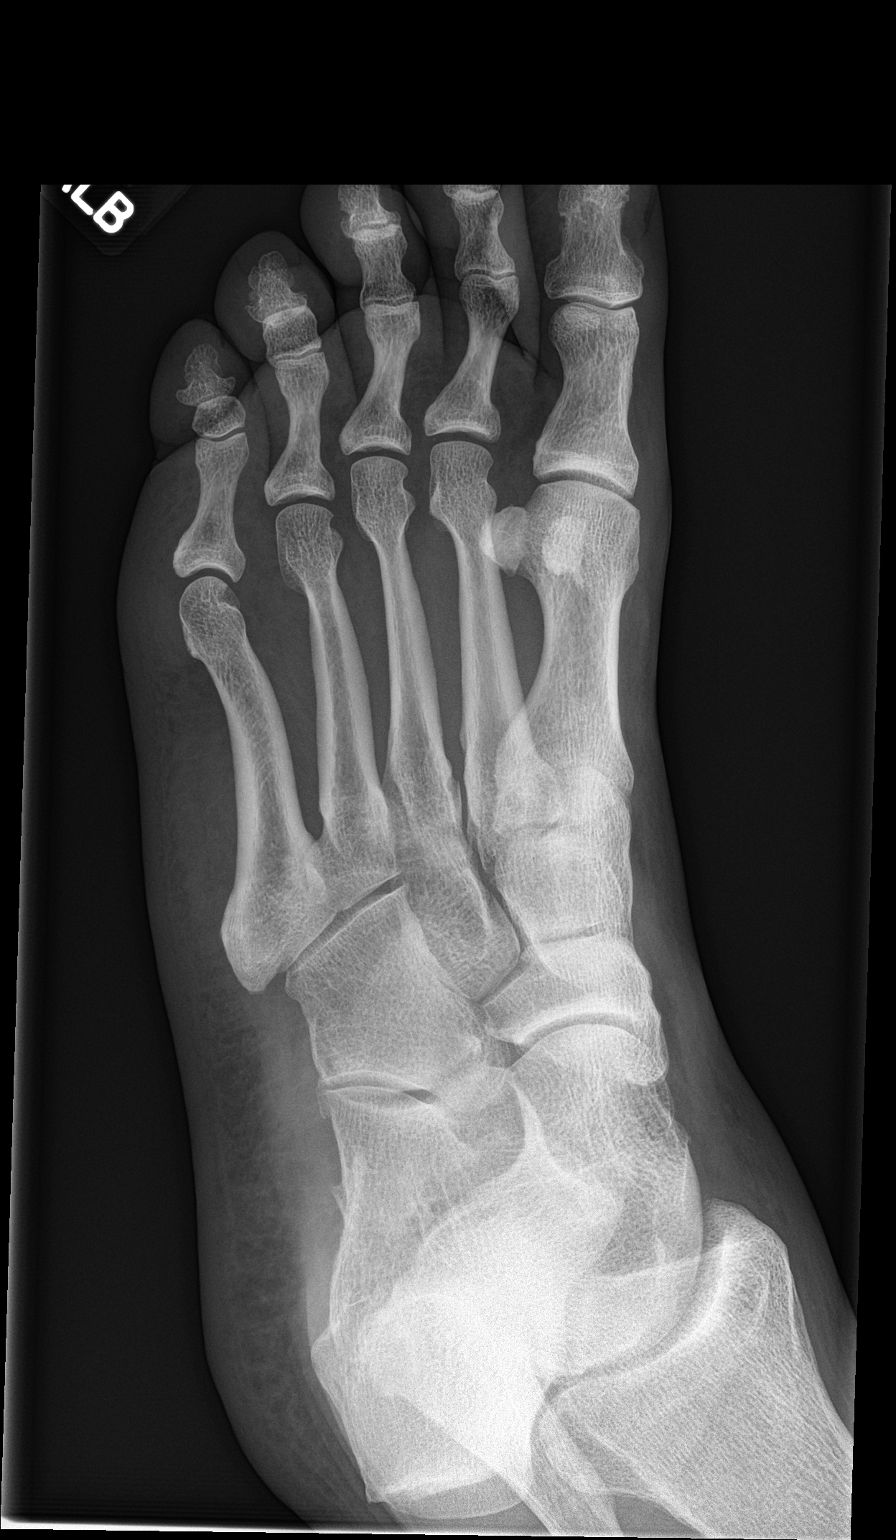

[foot lat]
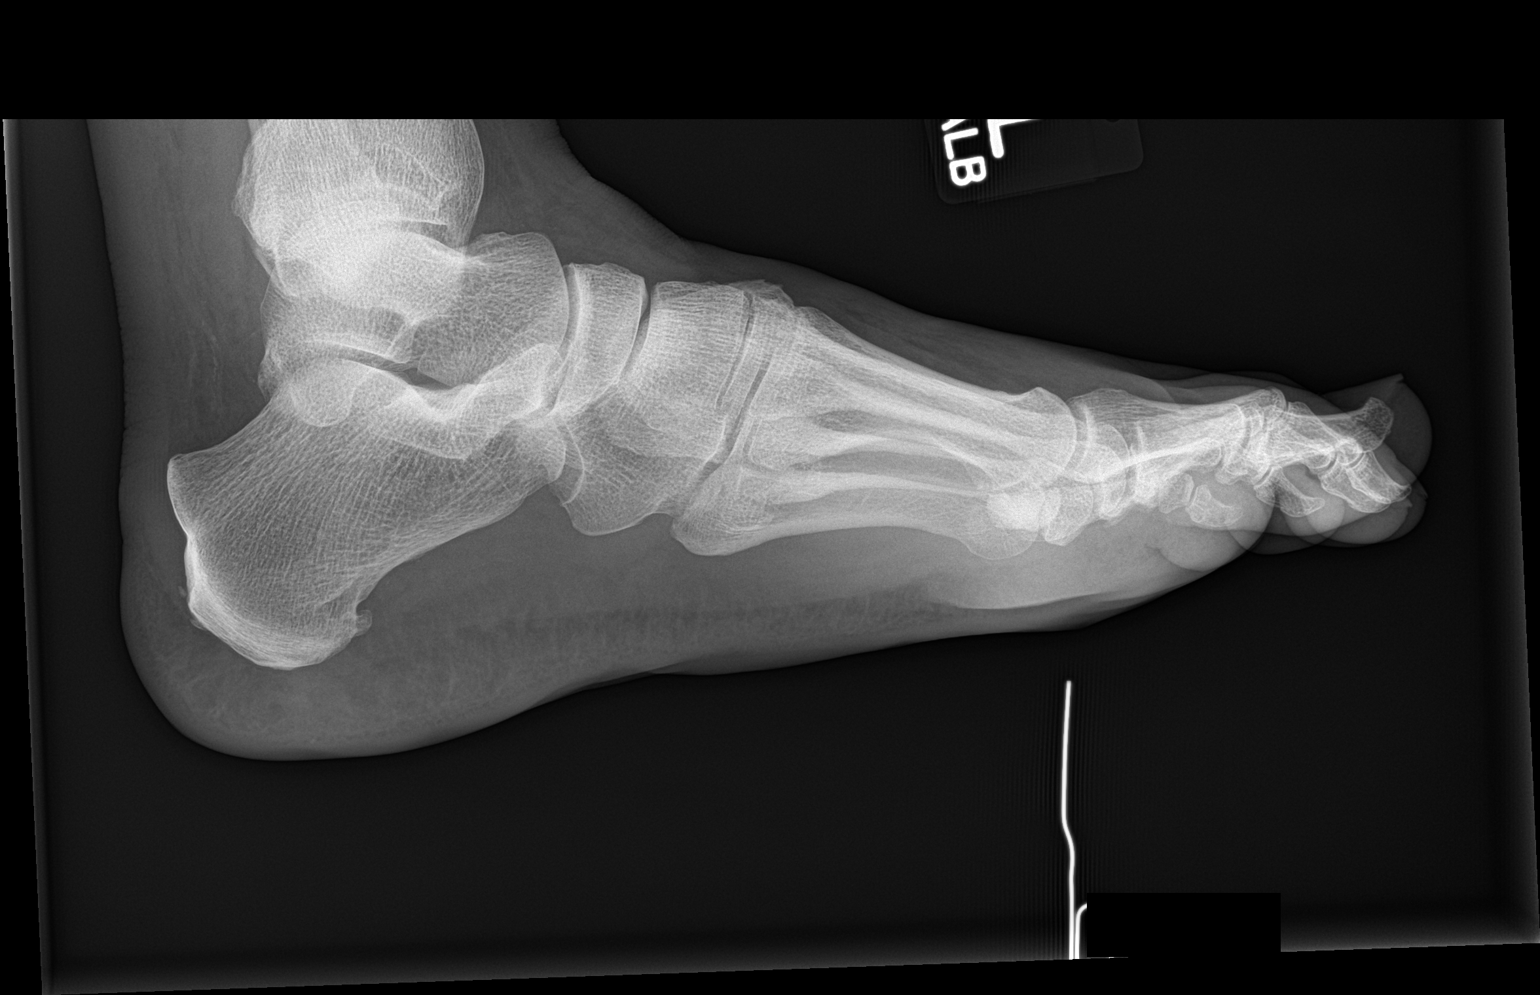

[3 of 3 positions shown; findings below may reference images not displayed]

FINDINGS: No fracture, dislocation or suspicious focal osseous lesion.
Lisfranc joint appears intact. There is soft tissue swelling in the
plantar distal left foot. No radiopaque foreign body or other
abnormal soft tissue density. No appreciable soft tissue gas. No
appreciable degenerative or erosive arthropathy. No cortical
erosions or periosteal reaction. Vascular calcifications. Small
Achilles and plantar left calcaneal spurs.
IMPRESSION: Soft tissue swelling in the plantar distal left foot. No
radiographic evidence of osteomyelitis. No radiopaque foreign body.

## 2018-01-24 ENCOUNTER — Other Ambulatory Visit: Payer: Self-pay | Admitting: Unknown Physician Specialty

## 2018-04-11 ENCOUNTER — Other Ambulatory Visit: Payer: Self-pay | Admitting: Family Medicine

## 2018-05-15 ENCOUNTER — Other Ambulatory Visit: Payer: Self-pay | Admitting: Unknown Physician Specialty

## 2018-10-12 ENCOUNTER — Other Ambulatory Visit: Payer: Self-pay | Admitting: Unknown Physician Specialty

## 2018-12-17 ENCOUNTER — Encounter: Payer: Self-pay | Admitting: Emergency Medicine

## 2018-12-17 ENCOUNTER — Other Ambulatory Visit: Payer: Self-pay

## 2018-12-17 ENCOUNTER — Ambulatory Visit
Admission: EM | Admit: 2018-12-17 | Discharge: 2018-12-17 | Disposition: A | Discharge status: Home / Self Care | Payer: Medicare Other | Attending: Family Medicine | Admitting: Family Medicine

## 2018-12-17 DIAGNOSIS — D508 Other iron deficiency anemias: Secondary | ICD-10-CM | POA: Diagnosis not present

## 2018-12-17 DIAGNOSIS — R42 Dizziness and giddiness: Secondary | ICD-10-CM | POA: Diagnosis not present

## 2018-12-17 DIAGNOSIS — E871 Hypo-osmolality and hyponatremia: Secondary | ICD-10-CM | POA: Diagnosis not present

## 2018-12-17 LAB — CBC WITH DIFFERENTIAL/PLATELET
ABS IMMATURE GRANULOCYTES: 0.03 10*3/uL (ref 0.00–0.07)
BASOS ABS: 0.1 10*3/uL (ref 0.0–0.1)
Basophils Relative: 1 %
Eosinophils Absolute: 0.3 10*3/uL (ref 0.0–0.5)
Eosinophils Relative: 5 %
HEMATOCRIT: 28.8 % — AB (ref 39.0–52.0)
Hemoglobin: 8.6 g/dL — ABNORMAL LOW (ref 13.0–17.0)
IMMATURE GRANULOCYTES: 1 %
Lymphocytes Relative: 21 %
Lymphs Abs: 1.2 10*3/uL (ref 0.7–4.0)
MCH: 20.1 pg — ABNORMAL LOW (ref 26.0–34.0)
MCHC: 29.9 g/dL — ABNORMAL LOW (ref 30.0–36.0)
MCV: 67.3 fL — ABNORMAL LOW (ref 80.0–100.0)
Monocytes Absolute: 0.7 10*3/uL (ref 0.1–1.0)
Monocytes Relative: 11 %
NEUTROS ABS: 3.6 10*3/uL (ref 1.7–7.7)
NEUTROS PCT: 61 %
NRBC: 0 % (ref 0.0–0.2)
PLATELETS: 317 10*3/uL (ref 150–400)
RBC: 4.28 MIL/uL (ref 4.22–5.81)
RDW: 19.7 % — AB (ref 11.5–15.5)
WBC: 5.8 10*3/uL (ref 4.0–10.5)

## 2018-12-17 LAB — COMPREHENSIVE METABOLIC PANEL
ALK PHOS: 72 U/L (ref 38–126)
ALT: 18 U/L (ref 0–44)
ANION GAP: 8 (ref 5–15)
AST: 19 U/L (ref 15–41)
Albumin: 4.1 g/dL (ref 3.5–5.0)
BUN: 17 mg/dL (ref 8–23)
CHLORIDE: 98 mmol/L (ref 98–111)
CO2: 22 mmol/L (ref 22–32)
Calcium: 8.6 mg/dL — ABNORMAL LOW (ref 8.9–10.3)
Creatinine, Ser: 0.65 mg/dL (ref 0.61–1.24)
GFR calc non Af Amer: >60 mL/min (ref >60)
Glucose, Bld: 122 mg/dL — ABNORMAL HIGH (ref 70–99)
POTASSIUM: 4 mmol/L (ref 3.5–5.1)
SODIUM: 128 mmol/L — AB (ref 135–145)
Total Bilirubin: 0.4 mg/dL (ref 0.3–1.2)
Total Protein: 7.2 g/dL (ref 6.5–8.1)

## 2018-12-17 NOTE — ED Triage Notes (Signed)
Pt c/o dizziness. Started yesterday. Moving quickly and standing up makes dizziness worse. He states that he can not focus and he has a headache in the frontal area. He describes the dizziness as feeling off balance.

## 2018-12-17 NOTE — Discharge Instructions (Signed)
Re-start iron tablets Follow up with primary care provider

## 2018-12-17 NOTE — ED Provider Notes (Signed)
[facility]    CSN: [id number] Arrival date & time: 12/17/18  1527     History   Chief Complaint Chief Complaint  Patient presents with  . Dizziness    HPI Eugene Clements is a 78 y.o. male.   78 yo male with a c/o dizziness, worse with position changes, since yesterday. Denies any injuries, chest pains, palpitations, fevers, chills, numbness/tingling, one-sided weakness, vision changes, vomiting.   The history is provided by the patient.    Past Medical History:  Diagnosis Date  . BPH (benign prostatic hypertrophy) with urinary obstruction   . Depression   . Diabetes mellitus without complication (HCC)   . Diverticulitis   . Hyperlipidemia   . Hyponatremia   . Organic bipolar disorder (HCC)   . Sleep apnea     Patient Active Problem List   Diagnosis Date Noted  . Leg pain, anterior, right 07/28/2016  . Anemia, iron deficiency 02/25/2016  . BPH with obstruction/lower urinary tract symptoms 01/21/2016  . Depression 09/29/2015  . Hypercholesterolemia 06/29/2015  . Essential hypertension, benign 06/29/2015  . Generalized abdominal pain 03/12/2015  . Type 2 diabetes mellitus with neurologic complication, without long-term current use of insulin (HCC) 03/12/2015    Past Surgical History:  Procedure Laterality Date  . BACK SURGERY    . CIRCUMCISION  2002  . TONSILLECTOMY         Home Medications    Prior to Admission medications   Medication Sig Start Date End Date Taking? Authorizing Provider  acetaminophen (TYLENOL) 500 MG tablet Take by mouth at bedtime.   Yes [provider]  aspirin EC 81 MG tablet Take 81 mg by mouth daily.   Yes [provider]  atorvastatin (LIPITOR) 20 MG tablet TAKE 1 TABLET BY MOUTH ONCE DAILY 05/22/17  Yes Crissman, Mark A, MD  benazepril (LOTENSIN) 40 MG tablet TAKE 1 TABLET BY MOUTH ONCE DAILY. 01/16/17  Yes Johnson, Megan P, DO  busPIRone (BUSPAR) 15 MG tablet Take 2 tablets (30 mg total) by mouth 2  (two) times daily. 09/12/16  Yes Crissman, Redge Gainer, MD  DULoxetine (CYMBALTA) 60 MG capsule TAKE 1 CAPSULE BY MOUTH DAILY 02/27/17  Yes Crissman, Redge Gainer, MD  LEVEMIR FLEXTOUCH 100 UNIT/ML Pen INJECT 20 TO 40 UNITS SUBCUTANEOUSLY AT BEDTIME 05/10/17  Yes Crissman, Mark A, MD  metFORMIN (GLUCOPHAGE-XR) 500 MG 24 hr tablet TAKE 2 TABLETS BY MOUTH ONCE DAILY WITH BREAKFAST. 03/13/17  Yes Gabriel Cirri, NP  Multiple Vitamin (MULTIVITAMIN) tablet Take 1 tablet by mouth daily.   Yes [provider]  omeprazole (PRILOSEC) 20 MG capsule TAKE 1 CAPSULE BY MOUTH TWICE DAILY 06/30/17  Yes Particia Nearing, PA-C  pregabalin (LYRICA) 50 MG capsule Take 1 capsule (50 mg total) by mouth 2 (two) times daily. Patient taking differently: Take 75 mg by mouth 3 (three) times daily.  10/20/16  Yes Steele Sizer, MD  tamsulosin (FLOMAX) 0.4 MG CAPS capsule TAKE 1 CAPSULE BY MOUTH ONCE DAILY 01/05/17  Yes Particia Nearing, PA-C  busPIRone (BUSPAR) 15 MG tablet TAKE 2 TABLETS BY MOUTH TWICE DAILY 05/09/17   Steele Sizer, MD  docusate sodium (COLACE) 100 MG capsule Take 100 mg by mouth at bedtime.    [provider]  HYDROcodone-acetaminophen (NORCO/VICODIN) 5-325 MG tablet Take 1 tablet by mouth every 6 (six) hours as needed for moderate pain or severe pain. Do not drive while taking this medication. Do not take any other medications containing Tylenol while  you're taking this medication. Patient not taking: Reported on 02/09/2017 07/25/16   Particia Nearing, PA-C  metFORMIN (GLUMETZA) 500 MG (MOD) 24 hr tablet Take 2 tablets (1,000 mg total) by mouth daily with breakfast. 01/21/16   Steele Sizer, MD  naproxen (NAPROSYN) 500 MG tablet TAKE 1 TABLET BY MOUTH TWICE DAILY WITH MEALS Patient taking differently: TAKE 1 TABLET BY MOUTH DAILY 09/26/16   Particia Nearing, PA-C  NOVOFINE 32G X 6 MM MISC USE TO INJECT INSULIN AT BEDTIME 10/30/15   Johnson, Megan P, DO  traZODone (DESYREL) 50 MG  tablet Take 0.5-1 tablets (25-50 mg total) by mouth at bedtime as needed for sleep. Patient not taking: Reported on 02/09/2017 07/12/16   Steele Sizer, MD  UNIFINE PENTIPS 32G X 4 MM MISC USE TO INJECT INSULIN AT BEDTIME 12/12/16   Gabriel Cirri, NP    Family History Family History  Problem Relation Age of Onset  . Cancer Mother        breast  . Diabetes Mother   . Hypertension Mother   . Cancer Father        prostate    Social History Social History   Tobacco Use  . Smoking status: Former Smoker    Last attempt to quit: 06/28/1969    Years since quitting: 49.5  . Smokeless tobacco: Never Used  Substance Use Topics  . Alcohol use: Yes    Alcohol/week: 20.0 standard drinks    Types: 10 Cans of beer, 10 Standard drinks or equivalent per week    Comment: Beer  . Drug use: No     Allergies   Pneumococcal vaccine   Review of Systems Review of Systems   Physical Exam Triage Vital Signs ED Triage Vitals  Enc Vitals Group     BP --      Pulse --      Resp --      Temp --      Temp src --      SpO2 --      Weight 12/17/18 1558 240 lb (108.9 kg)     Height 12/17/18 1558  (1.778 m)     Head Circumference --      Peak Flow --      Pain Score 12/17/18 1557 3     Pain Loc --      Pain Edu? --      Excl. in GC? --    No data found.  Updated Vital Signs BP (!) 141/68 (BP Location: Left Arm)   Pulse 72   Temp 97.7 F (36.5 C) (Oral)   Ht  (1.778 m)   Wt 108.9 kg   SpO2 97%   BMI 34.44 kg/m   Visual Acuity Right Eye Distance:   Left Eye Distance:   Bilateral Distance:    Right Eye Near:   Left Eye Near:    Bilateral Near:     Physical Exam Vitals signs and nursing note reviewed.  Constitutional:      General: He is not in acute distress.    Appearance: He is well-developed. He is not toxic-appearing or diaphoretic.  HENT:     Head: Normocephalic and atraumatic.     Mouth/Throat:     Pharynx: Uvula midline. No oropharyngeal exudate.      Tonsils: No tonsillar abscesses.  Neck:     Musculoskeletal: Normal range of motion and neck supple.     Thyroid: No thyromegaly.  Trachea: No tracheal deviation.  Cardiovascular:     Rate and Rhythm: Normal rate and regular rhythm.     Heart sounds: Normal heart sounds.  Pulmonary:     Effort: Pulmonary effort is normal. No respiratory distress.     Breath sounds: Normal breath sounds. No stridor. No wheezing, rhonchi or rales.  Chest:     Chest wall: No tenderness.  Abdominal:     General: Bowel sounds are normal. There is no distension.     Palpations: Abdomen is soft. There is no mass.     Tenderness: There is no abdominal tenderness. There is no right CVA tenderness, left CVA tenderness, guarding or rebound.     Hernia: No hernia is present.  Lymphadenopathy:     Cervical: No cervical adenopathy.  Skin:    General: Skin is warm and dry.     Findings: No rash.  Neurological:     General: No focal deficit present.     Mental Status: He is alert and oriented to person, place, and time.     Cranial Nerves: No cranial nerve deficit.     Sensory: No sensory deficit.     Motor: No weakness.     Coordination: Coordination normal.     Gait: Gait normal.     Deep Tendon Reflexes: Reflexes normal.      UC Treatments / Results  Labs (all labs ordered are listed, but only abnormal results are displayed) Labs Reviewed  COMPREHENSIVE METABOLIC PANEL - Abnormal; Notable for the following components:      Result Value   Sodium 128 (*)    Glucose, Bld 122 (*)    Calcium 8.6 (*)    All other components within normal limits  CBC WITH DIFFERENTIAL/PLATELET - Abnormal; Notable for the following components:   Hemoglobin 8.6 (*)    HCT 28.8 (*)    MCV 67.3 (*)    MCH 20.1 (*)    MCHC 29.9 (*)    RDW 19.7 (*)    All other components within normal limits    EKG None  Radiology No results found.  Procedures ED EKG Date/Time: 12/17/2018 8:16 PM Performed by: Payton Mccallum, MD Authorized by: Payton Mccallum, MD   ECG reviewed by ED Physician in the absence of a cardiologist: yes   Interpretation:    Interpretation: normal   Rate:    ECG rate:  74   ECG rate assessment: normal   Rhythm:    Rhythm: sinus rhythm   Ectopy:    Ectopy: none   QRS:    QRS axis:  Normal Conduction:    Conduction: normal   ST segments:    ST segments:  Normal T waves:    T waves: normal     (including critical care time)  Medications Ordered in UC Medications - No data to display  Initial Impression / Assessment and Plan / UC Course  I have reviewed the triage vital signs and the nursing notes.  Pertinent labs & imaging results that were available during my care of the patient were reviewed by me and considered in my medical decision making (see chart for details).      Final Clinical Impressions(s) / UC Diagnoses   Final diagnoses:  Dizziness  Other iron deficiency anemia  Chronic hyponatremia     Discharge Instructions     Re-start iron tablets Follow up with primary care provider    ED Prescriptions    None  1. Labs/ekg results and diagnosis reviewed with patient 2. Recommend re-start iron tablets and follow up with PCP for further evaluation and management (possible referrals) 3. Follow-up prn  Controlled Substance Prescriptions [place] Controlled Substance Registry consulted? Not Applicable   Payton Mccallum, MD 12/17/18 2023

## 2019-06-30 ENCOUNTER — Other Ambulatory Visit: Payer: Self-pay | Admitting: Unknown Physician Specialty

## 2019-07-01 NOTE — Telephone Encounter (Signed)
Requested medication (s) are due for refill today: yes  Requested medication (s) are on the active medication list: yes  Last refill:  10/12/2018  Future visit scheduled:no  Notes to clinic:  Review for refill   Requested Prescriptions  Pending Prescriptions Disp Refills   ULTICARE MICRO PEN NEEDLES 32G X 4 MM [place] [Pharmacy Med Name: ULTICARE MICRO PEN NEEDLES 32G X 4] 100 each 12    Sig: USE AS DIRECTED TO INJECT UNSULIN AT BEDTIME     Endocrinology: Diabetes - Testing Supplies Failed - 06/30/2019  1:42 PM      Failed - Valid encounter within last 12 months    Recent Outpatient Visits          2 years ago Essential hypertension, benign   [facility] Crissman, Mark A, MD   2 years ago Type 2 diabetes mellitus with diabetic autonomic neuropathy, without long-term current use of insulin ([place])   [place] Crissman, Jeannette How, MD   2 years ago Right leg pain   [facility] [place], [place]   3 years ago Pain of right lower extremity   [place], [place]   3 years ago Immunization due   [employer], Jeannette How, MD

## 2021-12-22 ENCOUNTER — Other Ambulatory Visit: Payer: Self-pay

## 2021-12-22 ENCOUNTER — Ambulatory Visit
Admission: EM | Admit: 2021-12-22 | Discharge: 2021-12-22 | Disposition: A | Discharge status: Home / Self Care | Payer: Medicare PPO | Attending: Emergency Medicine | Admitting: Emergency Medicine

## 2021-12-22 DIAGNOSIS — R42 Dizziness and giddiness: Secondary | ICD-10-CM | POA: Diagnosis present

## 2021-12-22 DIAGNOSIS — E871 Hypo-osmolality and hyponatremia: Secondary | ICD-10-CM | POA: Insufficient documentation

## 2021-12-22 DIAGNOSIS — E860 Dehydration: Secondary | ICD-10-CM | POA: Diagnosis present

## 2021-12-22 LAB — CBC WITH DIFFERENTIAL/PLATELET
Abs Immature Granulocytes: 0.04 10*3/uL (ref 0.00–0.07)
Basophils Absolute: 0 10*3/uL (ref 0.0–0.1)
Basophils Relative: 1 %
Eosinophils Absolute: 0.1 10*3/uL (ref 0.0–0.5)
Eosinophils Relative: 1 %
HCT: 41.1 % (ref 39.0–52.0)
Hemoglobin: 14.6 g/dL (ref 13.0–17.0)
Immature Granulocytes: 1 %
Lymphocytes Relative: 16 %
Lymphs Abs: 1.2 10*3/uL (ref 0.7–4.0)
MCH: 31.8 pg (ref 26.0–34.0)
MCHC: 35.5 g/dL (ref 30.0–36.0)
MCV: 89.5 fL (ref 80.0–100.0)
Monocytes Absolute: 0.6 10*3/uL (ref 0.1–1.0)
Monocytes Relative: 8 %
Neutro Abs: 5.7 10*3/uL (ref 1.7–7.7)
Neutrophils Relative %: 73 %
Platelets: 263 10*3/uL (ref 150–400)
RBC: 4.59 MIL/uL (ref 4.22–5.81)
RDW: 12.6 % (ref 11.5–15.5)
WBC: 7.7 10*3/uL (ref 4.0–10.5)
nRBC: 0 % (ref 0.0–0.2)

## 2021-12-22 LAB — COMPREHENSIVE METABOLIC PANEL
ALT: 18 U/L (ref 0–44)
AST: 21 U/L (ref 15–41)
Albumin: 4.3 g/dL (ref 3.5–5.0)
Alkaline Phosphatase: 63 U/L (ref 38–126)
Anion gap: 7 (ref 5–15)
BUN: 14 mg/dL (ref 8–23)
CO2: 25 mmol/L (ref 22–32)
Calcium: 9.2 mg/dL (ref 8.9–10.3)
Chloride: 97 mmol/L — ABNORMAL LOW (ref 98–111)
Creatinine, Ser: 0.73 mg/dL (ref 0.61–1.24)
GFR, Estimated: >60 mL/min (ref >60)
Glucose, Bld: 152 mg/dL — ABNORMAL HIGH (ref 70–99)
Potassium: 3.8 mmol/L (ref 3.5–5.1)
Sodium: 129 mmol/L — ABNORMAL LOW (ref 135–145)
Total Bilirubin: 0.7 mg/dL (ref 0.3–1.2)
Total Protein: 7.4 g/dL (ref 6.5–8.1)

## 2021-12-22 LAB — URINALYSIS, ROUTINE W REFLEX MICROSCOPIC
Bilirubin Urine: NEGATIVE
Glucose, UA: NEGATIVE mg/dL
Hgb urine dipstick: NEGATIVE
Ketones, ur: NEGATIVE mg/dL
Leukocytes,Ua: NEGATIVE
Nitrite: NEGATIVE
Protein, ur: NEGATIVE mg/dL
Specific Gravity, Urine: 1.01 (ref 1.005–1.030)
pH: 6 (ref 5.0–8.0)

## 2021-12-22 MED ORDER — SODIUM CHLORIDE 0.9 % IV BOLUS
500.0000 mL | Freq: Once | INTRAVENOUS | Status: AC
  Administered 2021-12-22: 500 mL via INTRAVENOUS

## 2021-12-22 NOTE — ED Provider Notes (Addendum)
?[facility] ? ? ? ?CSN: [id number] ?Arrival date & time: 12/22/21  1243 ? ? ?  ? ?History   ?Chief Complaint ?Chief Complaint  ?Patient presents with  ? Dizziness  ? ? ?HPI ?Eugene Clements is a 81 y.o. male.  ? ?HPI ? ?81 year old male here for evaluation of dizziness. ? ?Patient reports that he has been experiencing dizziness in both of room spinning and feeling off balance since about 9 AM this morning.  He states that this dizziness increases with change of position and gets better with sitting.  He states he feels very unsteady when he is up walking.  He did get up this morning and go walking at the [employer] center with some other individuals and walked for about 40 minutes.  He denies any headache, change in vision, chest pain, shortness of breath, sweating, or passing out.  He does have a history of anemia and hyponatremia.  Patient also reports that he drinks daily to the amount of 212 ounce bottles of beer and 1-1-1/2 187 mL bottles of wine.  He states that this is a drastic reduction from what he had been consuming.  He reports that he started drinking when his wife was terminally ill because he was having trouble sleeping and he discontinued the habit.  He states that he has had this dizziness off and on over the years its been attributed to both anemia and low sodium. ? ?Past Medical History:  ?Diagnosis Date  ? BPH (benign prostatic hypertrophy) with urinary obstruction   ? Depression   ? Diabetes mellitus without complication (HCC)   ? Diverticulitis   ? Hyperlipidemia   ? Hyponatremia   ? Organic bipolar disorder (HCC)   ? Sleep apnea   ? ? ?Patient Active Problem List  ? Diagnosis Date Noted  ? Leg pain, anterior, right 07/28/2016  ? Anemia, iron deficiency 02/25/2016  ? BPH with obstruction/lower urinary tract symptoms 01/21/2016  ? Depression 09/29/2015  ? Hypercholesterolemia 06/29/2015  ? Essential hypertension, benign 06/29/2015  ? Generalized abdominal pain 03/12/2015  ? Type 2  diabetes mellitus with neurologic complication, without long-term current use of insulin (HCC) 03/12/2015  ? ? ?Past Surgical History:  ?Procedure Laterality Date  ? BACK SURGERY    ? CIRCUMCISION  2002  ? TONSILLECTOMY    ? ? ? ? ? ?Home Medications   ? ?Prior to Admission medications   ?Medication Sig Start Date End Date Taking? Authorizing Provider  ?acetaminophen (TYLENOL) 500 MG tablet Take by mouth at bedtime.    [provider]  ?aspirin EC 81 MG tablet Take 81 mg by mouth daily.    [provider]  ?atorvastatin (LIPITOR) 20 MG tablet TAKE 1 TABLET BY MOUTH ONCE DAILY 05/22/17   Steele Sizer, MD  ?benazepril (LOTENSIN) 40 MG tablet TAKE 1 TABLET BY MOUTH ONCE DAILY. 01/16/17   Olevia Perches P, DO  ?busPIRone (BUSPAR) 15 MG tablet Take 2 tablets (30 mg total) by mouth 2 (two) times daily. 09/12/16   Steele Sizer, MD  ?busPIRone (BUSPAR) 15 MG tablet TAKE 2 TABLETS BY MOUTH TWICE DAILY 05/09/17   Steele Sizer, MD  ?Cyanocobalamin (VITAMIN B-12 PO) Take by mouth.    [provider]  ?docusate sodium (COLACE) 100 MG capsule Take 100 mg by mouth at bedtime.    [provider]  ?DULoxetine (CYMBALTA) 60 MG capsule TAKE 1 CAPSULE BY MOUTH DAILY 02/27/17   Steele Sizer, MD  ?HYDROcodone-acetaminophen (  NORCO/VICODIN) 5-325 MG tablet Take 1 tablet by mouth every 6 (six) hours as needed for moderate pain or severe pain. Do not drive while taking this medication. Do not take any other medications containing Tylenol while you're taking this medication. ?Patient not taking: Reported on 02/09/2017 07/25/16   Particia NearingLane, Rachel Elizabeth, PA-C  ?LEVEMIR FLEXTOUCH 100 UNIT/ML Pen INJECT 20 TO 40 UNITS SUBCUTANEOUSLY AT BEDTIME 05/10/17   Steele Sizerrissman, Mark A, MD  ?metFORMIN (GLUCOPHAGE-XR) 500 MG 24 hr tablet TAKE 2 TABLETS BY MOUTH ONCE DAILY WITH BREAKFAST. 03/13/17   Gabriel CirriWicker, Cheryl, NP  ?metFORMIN (GLUMETZA) 500 MG (MOD) 24 hr tablet Take 2 tablets (1,000 mg total) by mouth daily with  breakfast. 01/21/16   Steele Sizerrissman, Mark A, MD  ?Multiple Vitamin (MULTIVITAMIN) tablet Take 1 tablet by mouth daily.    [provider]  ?naproxen (NAPROSYN) 500 MG tablet TAKE 1 TABLET BY MOUTH TWICE DAILY WITH MEALS 09/26/16   Particia NearingLane, Rachel Elizabeth, PA-C  ?NOVOFINE 32G X 6 MM MISC USE TO INJECT INSULIN AT BEDTIME 10/30/15   Johnson, Megan P, DO  ?omeprazole (PRILOSEC) 20 MG capsule TAKE 1 CAPSULE BY MOUTH TWICE DAILY 06/30/17   Particia NearingLane, Rachel Elizabeth, PA-C  ?pregabalin (LYRICA) 50 MG capsule Take 1 capsule (50 mg total) by mouth 2 (two) times daily. ?Patient taking differently: Take 75 mg by mouth 3 (three) times daily.  10/20/16   Steele Sizerrissman, Mark A, MD  ?tamsulosin (FLOMAX) 0.4 MG CAPS capsule TAKE 1 CAPSULE BY MOUTH ONCE DAILY 01/05/17   Particia NearingLane, Rachel Elizabeth, PA-C  ?traZODone (DESYREL) 50 MG tablet Take 0.5-1 tablets (25-50 mg total) by mouth at bedtime as needed for sleep. ?Patient not taking: Reported on 02/09/2017 07/12/16   Steele Sizerrissman, Mark A, MD  ?Ebbie LatusUNIFINE PENTIPS 32G X 4 MM MISC USE TO INJECT INSULIN AT BEDTIME 12/12/16   Gabriel CirriWicker, Cheryl, NP  ? ? ?Family History ?Family History  ?Problem Relation Age of Onset  ? Cancer Mother   ?     breast  ? Diabetes Mother   ? Hypertension Mother   ? Cancer Father   ?     prostate  ? ? ?Social History ?Social History  ? ?Tobacco Use  ? Smoking status: Former  ?  Types: Cigarettes  ?  Quit date: 06/28/1969  ?  Years since quitting: 52.5  ? Smokeless tobacco: Never  ?Vaping Use  ? Vaping Use: Never used  ?Substance Use Topics  ? Alcohol use: Yes  ?  Alcohol/week: 20.0 standard drinks  ?  Types: 10 Cans of beer, 10 Standard drinks or equivalent per week  ?  Comment: Beer  ? Drug use: No  ? ? ? ?Allergies   ?Pneumococcal vaccine ? ? ?Review of Systems ?Review of Systems  ?Eyes:  Negative for visual disturbance.  ?Respiratory:  Negative for shortness of breath.   ?Cardiovascular:  Negative for chest pain and palpitations.  ?Skin:  Negative for rash.  ?Neurological:  Positive for  dizziness. Negative for syncope, weakness and headaches.  ?Hematological: Negative.   ?Psychiatric/Behavioral: Negative.    ? ? ?Physical Exam ?Triage Vital Signs ?ED Triage Vitals  ?Enc Vitals Group  ?   BP 12/22/21 1304 (!) 153/79  ?   Pulse Rate 12/22/21 1304 66  ?   Resp --   ?   Temp --   ?   Temp Source 12/22/21 1304 Oral  ?   SpO2 12/22/21 1304 100 %  ?   Weight --   ?   Height --   ?  Head Circumference --   ?   Peak Flow --   ?   Pain Score 12/22/21 1308 0  ?   Pain Loc --   ?   Pain Edu? --   ?   Excl. in GC? --   ? ?No data found. ? ?Updated Vital Signs ?BP (!) 153/79 (BP Location: Right Arm)   Pulse 66   SpO2 100%  ? ?Visual Acuity ?Right Eye Distance:   ?Left Eye Distance:   ?Bilateral Distance:   ? ?Right Eye Near:   ?Left Eye Near:    ?Bilateral Near:    ? ?Physical Exam ?Vitals and nursing note reviewed.  ?Constitutional:   ?   General: He is not in acute distress. ?   Appearance: Normal appearance. He is not ill-appearing.  ?HENT:  ?   Head: Normocephalic and atraumatic.  ?Eyes:  ?   General: No scleral icterus. ?   Extraocular Movements: Extraocular movements intact.  ?   Pupils: Pupils are equal, round, and reactive to light.  ?   Comments: Bilateral conjunctiva are pale.  ?Cardiovascular:  ?   Rate and Rhythm: Normal rate and regular rhythm.  ?   Pulses: Normal pulses.  ?   Heart sounds: Normal heart sounds. No murmur heard. ?  No friction rub. No gallop.  ?Pulmonary:  ?   Effort: Pulmonary effort is normal.  ?   Breath sounds: Normal breath sounds. No wheezing, rhonchi or rales.  ?Musculoskeletal:  ?   Cervical back: Normal range of motion and neck supple.  ?Skin: ?   General: Skin is warm and dry.  ?   Capillary Refill: Capillary refill takes less than 2 seconds.  ?   Findings: No erythema or rash.  ?Neurological:  ?   General: No focal deficit present.  ?   Mental Status: He is alert and oriented to person, place, and time.  ?   Cranial Nerves: No cranial nerve deficit.  ?   Sensory: No  sensory deficit.  ?   Motor: No weakness.  ?Psychiatric:     ?   Mood and Affect: Mood normal.     ?   Behavior: Behavior normal.     ?   Thought Content: Thought content normal.     ?   Judgment: Judgment normal.

## 2021-12-22 NOTE — Discharge Instructions (Addendum)
Increase your dietary intake of sodium rich foods to help return your sodium level normal.  Include foods like cheese, olives, and pickles. ? ?You also need to increase your dietary intake of water and caffeine free beverages to prevent dehydration and return of your dizziness. ? ?Cut back on your carbohydrate intake as well as your alcohol intake to help improve hydration and also prevent diarrhea from those 2 substances interacting with your metformin. ? ?Return for reevaluation, or see your PCP, for new or continued symptoms. ?

## 2021-12-22 NOTE — ED Notes (Signed)
Provider at bedside

## 2021-12-22 NOTE — ED Triage Notes (Signed)
Patient presents to Urgent Care with complaints of dizziness since 0900. He states he was walking when he began feeling dizzy. Pt states he thought he was possible dehydrated. Drunk a 12 oz sprite. Notes dizziness with changing positions.  ? ?Denies HA, SOB, chest pain.   ?

## 2024-03-27 ENCOUNTER — Encounter: Payer: Self-pay | Admitting: Emergency Medicine

## 2024-03-27 ENCOUNTER — Ambulatory Visit
Admission: EM | Admit: 2024-03-27 | Discharge: 2024-03-27 | Disposition: A | Discharge status: Home / Self Care | Attending: Emergency Medicine | Admitting: Emergency Medicine

## 2024-03-27 DIAGNOSIS — W57XXXA Bitten or stung by nonvenomous insect and other nonvenomous arthropods, initial encounter: Secondary | ICD-10-CM | POA: Diagnosis not present

## 2024-03-27 DIAGNOSIS — R21 Rash and other nonspecific skin eruption: Secondary | ICD-10-CM | POA: Diagnosis not present

## 2024-03-27 DIAGNOSIS — S90562A Insect bite (nonvenomous), left ankle, initial encounter: Secondary | ICD-10-CM | POA: Diagnosis not present

## 2024-03-27 MED ORDER — KETOROLAC TROMETHAMINE 30 MG/ML IJ SOLN
30.0000 mg | Freq: Once | INTRAMUSCULAR | Status: AC
Start: 2024-03-27 — End: 2024-03-27
  Administered 2024-03-27: 30 mg via INTRAMUSCULAR

## 2024-03-27 NOTE — ED Provider Notes (Signed)
 [facility]    CSN: [id number] Arrival date & time: 03/27/24  1151      History   Chief Complaint Chief Complaint  Patient presents with   Insect Bite    HPI Eugene Clements is a 83 y.o. male.   HPI  83 year old male with past medical history significant for sleep apnea, organic bipolar disorder, hyponatremia, hyperlipidemia, diverticulitis, diabetes, depression, and BPH presents for evaluation of a painful insect bite to his left ankle that he sustained approximately 1 hour ago.  He reports that he was in his backyard and he felt a sharp sting but he did not see what bit him.  He is complaining of a 14/10 pain.  He applied topical mometasone without any improvement of his symptoms.  He describes it as a sharp pain.  He does have peripheral neuropathy and takes Lyrica .  No drainage.  Past Medical History:  Diagnosis Date   BPH (benign prostatic hypertrophy) with urinary obstruction    Depression    Diabetes mellitus without complication (HCC)    Diverticulitis    Hyperlipidemia    Hyponatremia    Organic bipolar disorder (HCC)    Sleep apnea     Patient Active Problem List   Diagnosis Date Noted   Leg pain, anterior, right 07/28/2016   Anemia, iron deficiency 02/25/2016   BPH with obstruction/lower urinary tract symptoms 01/21/2016   Depression 09/29/2015   Hypercholesterolemia 06/29/2015   Essential hypertension, benign 06/29/2015   Generalized abdominal pain 03/12/2015   Type 2 diabetes mellitus with neurologic complication, without long-term current use of insulin  (HCC) 03/12/2015    Past Surgical History:  Procedure Laterality Date   BACK SURGERY     CIRCUMCISION  2002   TONSILLECTOMY         Home Medications    Prior to Admission medications   Medication Sig Start Date End Date Taking? Authorizing Provider  acetaminophen  (TYLENOL ) 500 MG tablet Take by mouth at bedtime.    [provider]  aspirin EC 81 MG tablet Take 81 mg by  mouth daily.    [provider]  atorvastatin  (LIPITOR) 20 MG tablet TAKE 1 TABLET BY MOUTH ONCE DAILY 05/22/17   Mansfield Seip, MD  benazepril  (LOTENSIN ) 40 MG tablet TAKE 1 TABLET BY MOUTH ONCE DAILY. 01/16/17   Lincoln Renshaw, Megan P, DO  busPIRone  (BUSPAR ) 15 MG tablet Take 2 tablets (30 mg total) by mouth 2 (two) times daily. 09/12/16   Mansfield Seip, MD  busPIRone  (BUSPAR ) 15 MG tablet TAKE 2 TABLETS BY MOUTH TWICE DAILY 05/09/17   Mansfield Seip, MD  Cyanocobalamin (VITAMIN B-12 PO) Take by mouth.    [provider]  docusate sodium (COLACE) 100 MG capsule Take 100 mg by mouth at bedtime.    [provider]  DULoxetine  (CYMBALTA ) 60 MG capsule TAKE 1 CAPSULE BY MOUTH DAILY 02/27/17   Mansfield Seip, MD  HYDROcodone -acetaminophen  (NORCO/VICODIN) 5-325 MG tablet Take 1 tablet by mouth every 6 (six) hours as needed for moderate pain or severe pain. Do not drive while taking this medication. Do not take any other medications containing Tylenol  while you're taking this medication. Patient not taking: Reported on 02/09/2017 07/25/16   Corbin Dess, PA-C  LEVEMIR  FLEXTOUCH 100 UNIT/ML Pen INJECT 20 TO 40 UNITS SUBCUTANEOUSLY AT BEDTIME 05/10/17   Mansfield Seip, MD  metFORMIN  (GLUCOPHAGE -XR) 500 MG 24 hr tablet TAKE 2 TABLETS BY MOUTH ONCE DAILY WITH BREAKFAST. 03/13/17   Wicker,  Bartholomew Light, NP  metFORMIN  (GLUMETZA ) 500 MG (MOD) 24 hr tablet Take 2 tablets (1,000 mg total) by mouth daily with breakfast. 01/21/16   Mansfield Seip, MD  Multiple Vitamin (MULTIVITAMIN) tablet Take 1 tablet by mouth daily.    [provider]  naproxen  (NAPROSYN ) 500 MG tablet TAKE 1 TABLET BY MOUTH TWICE DAILY WITH MEALS 09/26/16   Corbin Dess, PA-C  NOVOFINE 32G X 6 MM MISC USE TO INJECT INSULIN  AT BEDTIME 10/30/15   Johnson, Megan P, DO  omeprazole (PRILOSEC) 20 MG capsule TAKE 1 CAPSULE BY MOUTH TWICE DAILY 06/30/17   Corbin Dess, PA-C  pregabalin  (LYRICA ) 50 MG  capsule Take 1 capsule (50 mg total) by mouth 2 (two) times daily. Patient taking differently: Take 75 mg by mouth 3 (three) times daily.  10/20/16   Mansfield Seip, MD  tamsulosin  (FLOMAX ) 0.4 MG CAPS capsule TAKE 1 CAPSULE BY MOUTH ONCE DAILY 01/05/17   Corbin Dess, PA-C  traZODone  (DESYREL ) 50 MG tablet Take 0.5-1 tablets (25-50 mg total) by mouth at bedtime as needed for sleep. Patient not taking: Reported on 02/09/2017 07/12/16   Mansfield Seip, MD  UNIFINE PENTIPS 32G X 4 MM MISC USE TO INJECT INSULIN  AT BEDTIME 12/12/16   Cindee Crazier, NP    Family History Family History  Problem Relation Age of Onset   Cancer Mother        breast   Diabetes Mother    Hypertension Mother    Cancer Father        prostate    Social History Social History   Tobacco Use   Smoking status: Former    Current packs/day: 0.00    Types: Cigarettes    Quit date: 06/28/1969    Years since quitting: 54.7   Smokeless tobacco: Never  Vaping Use   Vaping status: Never Used  Substance Use Topics   Alcohol use: Yes    Alcohol/week: 20.0 standard drinks of alcohol    Types: 10 Cans of beer, 10 Standard drinks or equivalent per week    Comment: Beer   Drug use: No     Allergies   Pneumococcal vaccine   Review of Systems Review of Systems  Skin:  Positive for color change.     Physical Exam Triage Vital Signs ED Triage Vitals [03/27/24 1205]  Encounter Vitals Group     BP 121/69     Girls Systolic BP Percentile      Girls Diastolic BP Percentile      Boys Systolic BP Percentile      Boys Diastolic BP Percentile      Pulse Rate 71     Resp 16     Temp 97.7 F (36.5 C)     Temp Source Oral     SpO2 96 %     Weight      Height      Head Circumference      Peak Flow      Pain Score 10     Pain Loc      Pain Education      Exclude from Growth Chart    No data found.  Updated Vital Signs BP 121/69 (BP Location: Left Arm)   Pulse 71   Temp 97.7 F (36.5 C) (Oral)    Resp 16   SpO2 96%   Visual Acuity Right Eye Distance:   Left Eye Distance:   Bilateral Distance:    Right Eye Near:  Left Eye Near:    Bilateral Near:     Physical Exam Vitals and nursing note reviewed.  Constitutional:      Appearance: Normal appearance. He is not ill-appearing.  HENT:     Head: Normocephalic and atraumatic.   Skin:    General: Skin is warm and dry.     Capillary Refill: Capillary refill takes less than 2 seconds.     Findings: Erythema present.   Neurological:     General: No focal deficit present.     Mental Status: He is alert and oriented to person, place, and time.      UC Treatments / Results  Labs (all labs ordered are listed, but only abnormal results are displayed) Labs Reviewed - No data to display  EKG   Radiology No results found.  Procedures Procedures (including critical care time)  Medications Ordered in UC Medications  ketorolac (TORADOL) 30 MG/ML injection 30 mg (has no administration in time range)    Initial Impression / Assessment and Plan / UC Course  I have reviewed the triage vital signs and the nursing notes.  Pertinent labs & imaging results that were available during my care of the patient were reviewed by me and considered in my medical decision making (see chart for details).   Patient is a nontoxic-appearing 83 year old male presenting for evaluation of possible insect bite to his left and anteromedial ankle.  As you can see noted above, there is a small area of erythema without a definitive point of envenomation.  The erythema is blanchable.  DP and PT pulses in the left foot are 2+.  The patient is complaining of significant pain and has not taken anything for his pain.  Blood work from earlier this month shows a creatinine of 0.58.  I will have staff administer one-time IM dose of 30 mg Toradol to help patient with his discomfort.  I will discharge him home with a diagnosis of insect bite and have him  continue to take his over-the-counter Zyrtec during the day as needed for any itching that may develop and he may use Benadryl at bedtime.  I will have him continue to apply his topical mometasone daily to help with inflammation.  He may use over-the-counter Tylenol  and/or ibuprofen according the package instructions as needed for pain.  He may also apply ice to his ankle for 20 minutes at a time, 2-3 times a day, as needed for pain or inflammation.  Return precautions reviewed.   Final Clinical Impressions(s) / UC Diagnoses   Final diagnoses:  Rash and nonspecific skin eruption  Insect bite of left ankle, initial encounter     Discharge Instructions      As we discussed, you are most likely bitten by an insect in your yard but without having visualized that it is difficult to tell which type of insect it was.  Continue to apply your mometasone daily to help with inflammation.  We have given you an injection of Toradol in clinic and at home you may continue to use over-the-counter Tylenol  and/or ibuprofen according the package instructions as needed for discomfort.  You may also apply ice to the bite site for 20 minutes at a time, 2-3 times a day, as needed for pain and inflammation.  Make sure you have a cloth between ice and your skin so as to not cause skin damage.  Continue to take your Zyrtec during the day as needed for any itching and you may take up to  50 mg of Benadryl at bedtime to help with itching if needed.  If you continue to experience significant pain, have any increase in redness, swelling, red streaks up your leg, or fever I recommend you go to the ER for evaluation.     ED Prescriptions   None    PDMP not reviewed this encounter.   Kent Pear, NP 03/27/24 1229

## 2024-03-27 NOTE — Discharge Instructions (Signed)
 As we discussed, you are most likely bitten by an insect in your yard but without having visualized that it is difficult to tell which type of insect it was.  Continue to apply your mometasone daily to help with inflammation.  We have given you an injection of Toradol in clinic and at home you may continue to use over-the-counter Tylenol  and/or ibuprofen according the package instructions as needed for discomfort.  You may also apply ice to the bite site for 20 minutes at a time, 2-3 times a day, as needed for pain and inflammation.  Make sure you have a cloth between ice and your skin so as to not cause skin damage.  Continue to take your Zyrtec during the day as needed for any itching and you may take up to 50 mg of Benadryl at bedtime to help with itching if needed.  If you continue to experience significant pain, have any increase in redness, swelling, red streaks up your leg, or fever I recommend you go to the ER for evaluation.

## 2024-03-27 NOTE — ED Triage Notes (Signed)
 Pt bite by something in his backyard today. He has redness and swelling on his left foot. He put topical antibiotic ointment on the bite.
